# Patient Record
Sex: Male | Born: 1975 | Race: Black or African American | Hispanic: No | Marital: Single | Smoking: Never smoker
Health system: Southern US, Community
[De-identification: ages and names within clinical notes are randomized; demographics above are authoritative.]

## PROBLEM LIST (undated history)

## (undated) DIAGNOSIS — K7460 Unspecified cirrhosis of liver: Secondary | ICD-10-CM

## (undated) DIAGNOSIS — K8301 Primary sclerosing cholangitis: Secondary | ICD-10-CM

## (undated) HISTORY — PX: OTHER SURGICAL HISTORY: SHX169

## (undated) HISTORY — DX: Primary sclerosing cholangitis: K83.01

---

## 2017-10-11 DIAGNOSIS — K7460 Unspecified cirrhosis of liver: Secondary | ICD-10-CM

## 2017-10-11 HISTORY — DX: Unspecified cirrhosis of liver: K74.60

## 2018-01-07 ENCOUNTER — Emergency Department (HOSPITAL_COMMUNITY): Payer: PRIVATE HEALTH INSURANCE

## 2018-01-07 ENCOUNTER — Other Ambulatory Visit: Payer: Self-pay

## 2018-01-07 ENCOUNTER — Encounter (HOSPITAL_COMMUNITY): Payer: Self-pay | Admitting: Emergency Medicine

## 2018-01-07 ENCOUNTER — Emergency Department (HOSPITAL_COMMUNITY)
Admission: EM | Admit: 2018-01-07 | Discharge: 2018-01-07 | Disposition: A | Discharge status: Home / Self Care | Payer: PRIVATE HEALTH INSURANCE | Attending: Emergency Medicine | Admitting: Emergency Medicine

## 2018-01-07 DIAGNOSIS — R740 Nonspecific elevation of levels of transaminase and lactic acid dehydrogenase [LDH]: Secondary | ICD-10-CM | POA: Diagnosis not present

## 2018-01-07 DIAGNOSIS — M7989 Other specified soft tissue disorders: Secondary | ICD-10-CM

## 2018-01-07 DIAGNOSIS — R17 Unspecified jaundice: Secondary | ICD-10-CM | POA: Diagnosis not present

## 2018-01-07 DIAGNOSIS — R601 Generalized edema: Secondary | ICD-10-CM | POA: Insufficient documentation

## 2018-01-07 DIAGNOSIS — E8809 Other disorders of plasma-protein metabolism, not elsewhere classified: Secondary | ICD-10-CM

## 2018-01-07 DIAGNOSIS — R7401 Elevation of levels of liver transaminase levels: Secondary | ICD-10-CM

## 2018-01-07 DIAGNOSIS — R770 Abnormality of albumin: Secondary | ICD-10-CM | POA: Diagnosis not present

## 2018-01-07 DIAGNOSIS — R2243 Localized swelling, mass and lump, lower limb, bilateral: Secondary | ICD-10-CM | POA: Diagnosis present

## 2018-01-07 LAB — COMPREHENSIVE METABOLIC PANEL
ALBUMIN: 1.5 g/dL — AB (ref 3.5–5.0)
ALK PHOS: 410 U/L — AB (ref 38–126)
ALT: 74 U/L — ABNORMAL HIGH (ref 17–63)
ANION GAP: 7 (ref 5–15)
AST: 168 U/L — ABNORMAL HIGH (ref 15–41)
BILIRUBIN TOTAL: 14.1 mg/dL — AB (ref 0.3–1.2)
BUN: 10 mg/dL (ref 6–20)
CO2: 26 mmol/L (ref 22–32)
Calcium: 7.7 mg/dL — ABNORMAL LOW (ref 8.9–10.3)
Chloride: 102 mmol/L (ref 101–111)
Creatinine, Ser: 0.58 mg/dL — ABNORMAL LOW (ref 0.61–1.24)
GLUCOSE: 91 mg/dL (ref 65–99)
Potassium: 3.4 mmol/L — ABNORMAL LOW (ref 3.5–5.1)
Sodium: 135 mmol/L (ref 135–145)
TOTAL PROTEIN: 7.4 g/dL (ref 6.5–8.1)

## 2018-01-07 LAB — BRAIN NATRIURETIC PEPTIDE: B NATRIURETIC PEPTIDE 5: 34 pg/mL (ref 0.0–100.0)

## 2018-01-07 LAB — CBC WITH DIFFERENTIAL/PLATELET
BASOS ABS: 0.1 10*3/uL (ref 0.0–0.1)
BASOS PCT: 1 %
Eosinophils Absolute: 0.3 10*3/uL (ref 0.0–0.7)
Eosinophils Relative: 3 %
HEMATOCRIT: 32.6 % — AB (ref 39.0–52.0)
Hemoglobin: 11 g/dL — ABNORMAL LOW (ref 13.0–17.0)
Lymphocytes Relative: 12 %
Lymphs Abs: 1.2 10*3/uL (ref 0.7–4.0)
MCH: 29.5 pg (ref 26.0–34.0)
MCHC: 33.7 g/dL (ref 30.0–36.0)
MCV: 87.4 fL (ref 78.0–100.0)
MONO ABS: 0.7 10*3/uL (ref 0.1–1.0)
Monocytes Relative: 7 %
NEUTROS ABS: 7.7 10*3/uL (ref 1.7–7.7)
NEUTROS PCT: 77 %
Platelets: 319 10*3/uL (ref 150–400)
RBC: 3.73 MIL/uL — AB (ref 4.22–5.81)
RDW: 18.4 % — AB (ref 11.5–15.5)
WBC: 10 10*3/uL (ref 4.0–10.5)

## 2018-01-07 LAB — TROPONIN I: Troponin I: <0.03 ng/mL (ref <0.03)

## 2018-01-07 MED ORDER — SPIRONOLACTONE 25 MG PO TABS: 25.0000 mg | ORAL_TABLET | Freq: Every day | ORAL | 0 refills | Status: DC

## 2018-01-07 NOTE — Discharge Instructions (Addendum)
Stop taking the furosemide, for now.  We are prescribing spironolactone to help with the leg swelling.  Additionally it will help to use compression stockings, available for many drugstore, to help with the swelling.  Try to get the stockings that go above the knees.  Also, when you are at home, elevate your feet above your heart is much as possible.  Her test today showed that you have some liver problem, but it is not clear which type.  It is important to follow-up with a GI specialist, to help diagnose and treat you.  They will likely need to do some additional testing.  Also it is important to follow-up with a primary care doctor.  Use the attached list, to help you find a doctor, to see for regular medical care.  Also to help the leg swelling, avoid added salt in your food.

## 2018-01-07 NOTE — ED Provider Notes (Signed)
[facility] EMERGENCY DEPARTMENT Provider Note   CSN: [id number] Arrival date & time: 01/07/18  1031     History   Chief Complaint Chief Complaint  Patient presents with  . Foot Swelling    HPI Reginald Richardson is a 42 y.o. male.  He presents for evaluation of leg swelling ongoing for greater than 1 month, worse when working during the day, getting better at night when resting.  He was recently given Lasix, empirically to treat leg swelling, without improvement.  According to his mother he "scratches all over and has a rash on his legs."  No recent chest pain, shortness of breath, fever, chills, cough, weakness or dizziness.  He has not had other evaluations for this problem.  There are no other known modifying factors.  HPI  History reviewed. No pertinent past medical history.  There are no active problems to display for this patient.   Past Surgical History:  Procedure Laterality Date  . arm surgery          Home Medications    Prior to Admission medications   Medication Sig Start Date End Date Taking? Authorizing Provider  furosemide (LASIX) 20 MG tablet TAKE ONE TABLET BY MOUTH ONCE DAILY 12/29/17  Yes [provider]    Family History History reviewed. No pertinent family history.  Social History Social History   Tobacco Use  . Smoking status: Never Smoker  . Smokeless tobacco: Never Used  Substance Use Topics  . Alcohol use: Never    Frequency: Never  . Drug use: Never     Allergies   Patient has no known allergies.   Review of Systems Review of Systems  All other systems reviewed and are negative.    Physical Exam Updated Vital Signs BP 132/83   Pulse 98   Temp 97.8 F (36.6 C) (Oral)   Resp 16   Ht 6' (1.829 m)   Wt 77.1 kg (170 lb)   SpO2 100%   BMI 23.06 kg/m   Physical Exam  Constitutional: He is oriented to person, place, and time. He appears well-developed. No distress.  He appears somewhat unkempt and under  nourished.  HENT:  Head: Normocephalic and atraumatic.  Right Ear: External ear normal.  Left Ear: External ear normal.  Eyes: Pupils are equal, round, and reactive to light. Conjunctivae and EOM are normal.  Neck: Normal range of motion and phonation normal. Neck supple.  Cardiovascular: Normal rate, regular rhythm and normal heart sounds.  Pulmonary/Chest: Effort normal and breath sounds normal. No stridor. No respiratory distress. He has no wheezes. He has no rales. He exhibits no bony tenderness.  Abdominal: Soft. There is no tenderness.  Musculoskeletal: Normal range of motion. He exhibits edema (3+ bilateral lower extremity swelling).  Neurological: He is alert and oriented to person, place, and time. No cranial nerve deficit or sensory deficit. He exhibits normal muscle tone. Coordination normal.  Skin: Skin is warm, dry and intact.  Scattered rash, consisting of plaques with excoriations, diffusely on lower legs.  These areas of skin abnormality have the appearance of psoriasis.  Psychiatric: He has a normal mood and affect. His behavior is normal. Judgment and thought content normal.  Nursing note and vitals reviewed.    ED Treatments / Results  Labs (all labs ordered are listed, but only abnormal results are displayed) Labs Reviewed  COMPREHENSIVE METABOLIC PANEL - Abnormal; Notable for the following components:      Result Value   Potassium 3.4 (*)  Creatinine, Ser 0.58 (*)    Calcium 7.7 (*)    Albumin 1.5 (*)    AST 168 (*)    ALT 74 (*)    Alkaline Phosphatase 410 (*)    Total Bilirubin 14.1 (*)    All other components within normal limits  CBC WITH DIFFERENTIAL/PLATELET - Abnormal; Notable for the following components:   RBC 3.73 (*)    Hemoglobin 11.0 (*)    HCT 32.6 (*)    RDW 18.4 (*)    All other components within normal limits  TROPONIN I  BRAIN NATRIURETIC PEPTIDE    EKG EKG Interpretation  Date/Time:  Saturday January 07 2018 13:44:28  EDT Ventricular Rate:  86 PR Interval:    QRS Duration: 79 QT Interval:  371 QTC Calculation: 444 R Axis:   67 Text Interpretation:  Sinus rhythm Consider right atrial enlargement No old tracing to compare Confirmed by Mancel BaleWentz, Rogue Rafalski [phone]([id number]) on 01/07/2018 1:51:57 PM   Radiology Dg Chest 2 View  Result Date: 01/07/2018 CLINICAL DATA:  Bilateral leg swelling. EXAM: CHEST - 2 VIEW COMPARISON:  None. FINDINGS: Atelectasis in the right lung base and right middle lobe. The heart, hila, mediastinum, lungs, and pleura are otherwise normal. IMPRESSION: Atelectasis on the right.  No other acute abnormalities. Electronically Signed   By: Gerome Samavid  Williams III M.D   On: 01/07/2018 11:48    Procedures Procedures (including critical care time)  Medications Ordered in ED Medications - No data to display   Initial Impression / Assessment and Plan / ED Course  I have reviewed the triage vital signs and the nursing notes.  Pertinent labs & imaging results that were available during my care of the patient were reviewed by me and considered in my medical decision making (see chart for details).  Clinical Course as of Jan 07 1353  Sat Jan 07, 2018  1113 Patient has peripheral edema which is likely multifactorial.  He does not have any evident acute cardiac distress, or appear to be in congestive heart failure.  Screening evaluation initiated.   [EW]  1350 Normal  Brain natriuretic peptide [EW]  1352 Normal except slightly low potassium and creatinine, low albumin, elevated transaminase, elevated total bilirubin.  Comprehensive metabolic panel(!) [EW]  1353 Normal except hemoglobin low 11.0  CBC with Differential(!) [EW]  1353 Normal  Troponin I [EW]    Clinical Course User Index [EW] Mancel BaleWentz, Garald Rhew, MD     Patient Vitals for the past 24 hrs:  BP Temp Temp src Pulse Resp SpO2 Height Weight  01/07/18 1130 132/83 - - 98 16 100 % - -  01/07/18 1040 (!) 145/99 97.8 F (36.6 C) Oral 99 16 100 %  6' (1.829 m) 77.1 kg (170 lb)    1:22 PM Reevaluation with update and discussion. After initial assessment and treatment, an updated evaluation reveals he is comfortable has no further complaints.  He denies use of alcohol products.  He denies known exposure to hepatitis and states that he frequently donates blood.  Findings discussed with patient and mother, all questions answered. Mancel BaleElliott Dylynn Ketner    MDM-leg swelling without signs for central edema, suspect peripheral edema, likely multifactorial.  Doubt cellulitis, DVT, pneumonia, congestive heart failure, ACS, PE.  Incidental elevation of total bilirubin and low albumin, raising concern for intrinsic liver disease, possibly related to alcohol use.  Patient has not improved, with use of diuretic, Lasix.  Will give trial of spironolactone, and refer to GI for further management.  He will  likely need a primary care doctor as well.  He may benefit from the use of compression stockings, low-salt diet, and leg elevation.  Patient is nontoxic and does not require hospitalization at this time.  Discharge vital signs are normal.  Nursing Notes Reviewed/ Care Coordinated Applicable Imaging Reviewed Interpretation of Laboratory Data incorporated into ED treatment  The patient appears reasonably screened and/or stabilized for discharge and I doubt any other medical condition or other [facility] requiring further screening, evaluation, or treatment in the ED at this time prior to discharge.  Plan: Home Medications-stop Lasix, avoid APAP; Home Treatments-low-salt diet, compression stockings, elevate legs for swelling; return here if the recommended treatment, does not improve the symptoms; Recommended follow up-GI follow-up as soon as possible, to evaluate for cause of elevated bilirubin.  PCP follow-up for general medical care, no appointment as soon as possible    Final Clinical Impressions(s) / ED Diagnoses   Final diagnoses:  Elevated bilirubin  Transaminitis   Hypoalbuminemia  Leg swelling    ED Discharge Orders    None       Mancel Bale, MD 01/07/18 1402

## 2018-01-07 NOTE — ED Triage Notes (Signed)
Patient complaining of bilateral feet swelling x 2 weeks. States he went to Urgent Care and was given furosemide. States he has taken medication for approximately 2 weeks with no relief. Also complaining of intermittent shortness of breath.

## 2018-01-09 ENCOUNTER — Encounter: Payer: Self-pay | Admitting: Internal Medicine

## 2018-01-22 ENCOUNTER — Inpatient Hospital Stay (HOSPITAL_COMMUNITY)
Admission: EM | Admit: 2018-01-22 | Discharge: 2018-01-30 | DRG: 432 | Disposition: A | Discharge status: Home / Self Care | Payer: PRIVATE HEALTH INSURANCE | Attending: Internal Medicine | Admitting: Internal Medicine

## 2018-01-22 ENCOUNTER — Encounter (HOSPITAL_COMMUNITY): Payer: Self-pay | Admitting: Emergency Medicine

## 2018-01-22 ENCOUNTER — Inpatient Hospital Stay (HOSPITAL_COMMUNITY): Payer: PRIVATE HEALTH INSURANCE

## 2018-01-22 ENCOUNTER — Other Ambulatory Visit: Payer: Self-pay

## 2018-01-22 DIAGNOSIS — E871 Hypo-osmolality and hyponatremia: Secondary | ICD-10-CM | POA: Diagnosis present

## 2018-01-22 DIAGNOSIS — L02619 Cutaneous abscess of unspecified foot: Secondary | ICD-10-CM | POA: Diagnosis present

## 2018-01-22 DIAGNOSIS — E8770 Fluid overload, unspecified: Secondary | ICD-10-CM | POA: Diagnosis not present

## 2018-01-22 DIAGNOSIS — R7989 Other specified abnormal findings of blood chemistry: Secondary | ICD-10-CM

## 2018-01-22 DIAGNOSIS — R188 Other ascites: Secondary | ICD-10-CM | POA: Diagnosis present

## 2018-01-22 DIAGNOSIS — R945 Abnormal results of liver function studies: Secondary | ICD-10-CM | POA: Diagnosis present

## 2018-01-22 DIAGNOSIS — E8809 Other disorders of plasma-protein metabolism, not elsewhere classified: Secondary | ICD-10-CM

## 2018-01-22 DIAGNOSIS — D649 Anemia, unspecified: Secondary | ICD-10-CM | POA: Diagnosis not present

## 2018-01-22 DIAGNOSIS — K7460 Unspecified cirrhosis of liver: Principal | ICD-10-CM | POA: Diagnosis present

## 2018-01-22 DIAGNOSIS — K838 Other specified diseases of biliary tract: Secondary | ICD-10-CM | POA: Diagnosis not present

## 2018-01-22 DIAGNOSIS — D684 Acquired coagulation factor deficiency: Secondary | ICD-10-CM | POA: Diagnosis present

## 2018-01-22 DIAGNOSIS — K831 Obstruction of bile duct: Secondary | ICD-10-CM | POA: Diagnosis present

## 2018-01-22 DIAGNOSIS — J9811 Atelectasis: Secondary | ICD-10-CM | POA: Diagnosis present

## 2018-01-22 DIAGNOSIS — L989 Disorder of the skin and subcutaneous tissue, unspecified: Secondary | ICD-10-CM | POA: Diagnosis present

## 2018-01-22 DIAGNOSIS — R197 Diarrhea, unspecified: Secondary | ICD-10-CM | POA: Diagnosis present

## 2018-01-22 DIAGNOSIS — Z6825 Body mass index (BMI) 25.0-25.9, adult: Secondary | ICD-10-CM

## 2018-01-22 DIAGNOSIS — Z79899 Other long term (current) drug therapy: Secondary | ICD-10-CM | POA: Diagnosis not present

## 2018-01-22 DIAGNOSIS — L03116 Cellulitis of left lower limb: Secondary | ICD-10-CM | POA: Diagnosis present

## 2018-01-22 DIAGNOSIS — K8301 Primary sclerosing cholangitis: Secondary | ICD-10-CM | POA: Diagnosis present

## 2018-01-22 DIAGNOSIS — L03115 Cellulitis of right lower limb: Secondary | ICD-10-CM | POA: Diagnosis present

## 2018-01-22 DIAGNOSIS — L309 Dermatitis, unspecified: Secondary | ICD-10-CM | POA: Diagnosis present

## 2018-01-22 DIAGNOSIS — I8500 Esophageal varices without bleeding: Secondary | ICD-10-CM | POA: Diagnosis present

## 2018-01-22 DIAGNOSIS — E43 Unspecified severe protein-calorie malnutrition: Secondary | ICD-10-CM | POA: Diagnosis present

## 2018-01-22 DIAGNOSIS — R000 Tachycardia, unspecified: Secondary | ICD-10-CM | POA: Diagnosis present

## 2018-01-22 DIAGNOSIS — L298 Other pruritus: Secondary | ICD-10-CM | POA: Diagnosis present

## 2018-01-22 DIAGNOSIS — Z7682 Awaiting organ transplant status: Secondary | ICD-10-CM

## 2018-01-22 DIAGNOSIS — D638 Anemia in other chronic diseases classified elsewhere: Secondary | ICD-10-CM | POA: Diagnosis present

## 2018-01-22 DIAGNOSIS — L03119 Cellulitis of unspecified part of limb: Secondary | ICD-10-CM

## 2018-01-22 DIAGNOSIS — I82409 Acute embolism and thrombosis of unspecified deep veins of unspecified lower extremity: Secondary | ICD-10-CM

## 2018-01-22 HISTORY — DX: Unspecified cirrhosis of liver: K74.60

## 2018-01-22 LAB — COMPREHENSIVE METABOLIC PANEL
ALT: 76 U/L — ABNORMAL HIGH (ref 17–63)
ANION GAP: 10 (ref 5–15)
AST: 158 U/L — ABNORMAL HIGH (ref 15–41)
Albumin: 1.4 g/dL — ABNORMAL LOW (ref 3.5–5.0)
Alkaline Phosphatase: 389 U/L — ABNORMAL HIGH (ref 38–126)
BUN: 10 mg/dL (ref 6–20)
CO2: 24 mmol/L (ref 22–32)
Calcium: 8 mg/dL — ABNORMAL LOW (ref 8.9–10.3)
Chloride: 98 mmol/L — ABNORMAL LOW (ref 101–111)
Creatinine, Ser: 0.64 mg/dL (ref 0.61–1.24)
GFR calc non Af Amer: >60 mL/min (ref >60)
Glucose, Bld: 146 mg/dL — ABNORMAL HIGH (ref 65–99)
Potassium: 4 mmol/L (ref 3.5–5.1)
SODIUM: 132 mmol/L — AB (ref 135–145)
Total Bilirubin: 15 mg/dL — ABNORMAL HIGH (ref 0.3–1.2)
Total Protein: 7.5 g/dL (ref 6.5–8.1)

## 2018-01-22 LAB — CBC WITH DIFFERENTIAL/PLATELET
Basophils Absolute: 0.1 10*3/uL (ref 0.0–0.1)
Basophils Relative: 0 %
EOS ABS: 0.2 10*3/uL (ref 0.0–0.7)
EOS PCT: 1 %
HCT: 32.3 % — ABNORMAL LOW (ref 39.0–52.0)
Hemoglobin: 11.2 g/dL — ABNORMAL LOW (ref 13.0–17.0)
LYMPHS ABS: 2.3 10*3/uL (ref 0.7–4.0)
Lymphocytes Relative: 12 %
MCH: 30.2 pg (ref 26.0–34.0)
MCHC: 34.7 g/dL (ref 30.0–36.0)
MCV: 87.1 fL (ref 78.0–100.0)
MONOS PCT: 2 %
Monocytes Absolute: 0.3 10*3/uL (ref 0.1–1.0)
Neutro Abs: 16.8 10*3/uL (ref 1.7–7.7)
Neutrophils Relative %: 85 %
PLATELETS: 403 10*3/uL — AB (ref 150–400)
RBC: 3.71 MIL/uL — ABNORMAL LOW (ref 4.22–5.81)
RDW: 17.6 % — ABNORMAL HIGH (ref 11.5–15.5)
WBC: 19.7 10*3/uL — AB (ref 4.0–10.5)

## 2018-01-22 LAB — URINALYSIS, ROUTINE W REFLEX MICROSCOPIC
GLUCOSE, UA: NEGATIVE mg/dL
KETONES UR: NEGATIVE mg/dL
Leukocytes, UA: NEGATIVE
Nitrite: NEGATIVE
Protein, ur: NEGATIVE mg/dL
Specific Gravity, Urine: 1.021 (ref 1.005–1.030)
pH: 6 (ref 5.0–8.0)

## 2018-01-22 LAB — LACTIC ACID, PLASMA: Lactic Acid, Venous: 1.8 mmol/L (ref 0.5–1.9)

## 2018-01-22 LAB — ACETAMINOPHEN LEVEL: Acetaminophen (Tylenol), Serum: <10 ug/mL — ABNORMAL LOW (ref 10–30)

## 2018-01-22 LAB — PROTIME-INR
INR: 7.38
Prothrombin Time: 62.4 seconds — ABNORMAL HIGH (ref 11.4–15.2)

## 2018-01-22 LAB — PHOSPHORUS: PHOSPHORUS: 3.4 mg/dL (ref 2.5–4.6)

## 2018-01-22 LAB — ETHANOL: Alcohol, Ethyl (B): <10 mg/dL (ref <10)

## 2018-01-22 LAB — BILIRUBIN, DIRECT: BILIRUBIN DIRECT: 8 mg/dL — AB (ref 0.1–0.5)

## 2018-01-22 LAB — MAGNESIUM: Magnesium: 1.9 mg/dL (ref 1.7–2.4)

## 2018-01-22 MED ORDER — IOPAMIDOL (ISOVUE-300) INJECTION 61%
100.0000 mL | Freq: Once | INTRAVENOUS | Status: AC | PRN
  Administered 2018-01-22: 100 mL via INTRAVENOUS

## 2018-01-22 MED ORDER — VANCOMYCIN HCL IN DEXTROSE 1-5 GM/200ML-% IV SOLN
1000.0000 mg | Freq: Once | INTRAVENOUS | Status: AC
  Administered 2018-01-22: 1000 mg via INTRAVENOUS
  Filled 2018-01-22: qty 200

## 2018-01-22 MED ORDER — VITAMIN K1 10 MG/ML IJ SOLN
10.0000 mg | Freq: Once | INTRAVENOUS | Status: AC
  Administered 2018-01-22: 10 mg via INTRAVENOUS
  Filled 2018-01-22: qty 1

## 2018-01-22 MED ORDER — VITAMIN K1 10 MG/ML IJ SOLN
INTRAMUSCULAR | Status: AC
  Filled 2018-01-22: qty 1

## 2018-01-22 NOTE — H&P (Signed)
History and Physical    Shafer Swamy ZOX:[id number] DOB: 10-Feb-1976 DOA: 01/22/2018  PCP: Patient, No Pcp Per   Patient coming from: Home.  I have personally briefly reviewed patient's old medical records in [facility]  Chief Complaint: Feet swelling.  HPI: Reginald Richardson is a 42 y.o. male with no previous medical history who is coming to the emergency department due to lower extremity swelling for 2 weeks and 2 days of left foot pain/worsening swelling.  He noticed having swelling about 2 weeks ago and came to the ED.  Since then, he was using compression wraps on his legs and was improving until Friday when he noticed that his left foot was swelling and developing discomfort.  Since then, the swelling has increased and the tenderness has increased in intensity.  The area has also developed erythema.  On Saturday, the tenderness got very intense and he had shakes and chills for a short period.  He denies night sweats.  He denies trauma to the area.  He has had chronic jaundice and skin lesions for a lot of months or almost a year, according to his mother.  He has frequent pruritus and has multiple skin abrasions from scratching.  He recently started having occasional bleeding gums.  He does not have a PCP and has not establish since he was seen in the ED on 01/07/2018.  He denies headache, rhinorrhea, sore throat, dyspnea, hemoptysis, cough, chest pain, palpitations, dizziness, PND, orthopnea or pitting edema of the lower extremities.  He denies abdominal pain, nausea, vomiting, constipation, melena or hematochezia.  He has been getting soft to mildly loose stools BMs recently.  He states that his urine looks very dark, but denies dysuria or frequency.  No polyphagia, polydipsia or polyuria.  ED Course: Initial vital signs temperature 37.1C (98.7 F), pulse 116, respiration 16, blood pressure 143/93 mmHg and O2 sat 99% on room air.  He was given 1000 mg of vancomycin in the emergency  department.  His urinalysis shows an amber color with moderate hemoglobinuria and bilirubinuria.  White count was 19.7 with 85% neutrophils, 12% lymphocytes and 2% monocytes.  Hemoglobin 11.2 g/dL and platelets 811.  Hemoglobin 2 weeks ago was 11.0 g/dL.  PT was 62.4 seconds and INR 7.38.  His lactic acid was 1.8, sodium 132, potassium 4.0 chloride 98 and CO2 24 millimol/L.  Acetaminophen was less than 10, alcohol level less than 10, BUN was 10, creatinine 0.64, glucose 146, calcium 8.0, magnesium 1.9 and phosphorus 3.4 mg/dL.History of protein was 7.5 and albumin 1.4 g/dL.  AST 158, ALT 76 and alkaline phosphatase 389 U/L.  Total bilirubin was 15.0 and direct bilirubin 8.0 mg/dL.  Imaging: CT abdomen/pelvis shows 1. Several masslike areas noted within the liver, measuring 6.6 cm at the hepatic hilum and 4.8 cm at the left hepatic lobe. Malignancy cannot be excluded. Dynamic liver protocol MRI or CT would be helpful for further evaluation. 2. Findings of hepatic cirrhosis. Small volume ascites noted within the abdomen and pelvis. 3. 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 Varices noted about the distal esophagus and stomach. 4. Vague edema involving the wall of the colon likely reflects underlying hypoalbuminemia. 5. Nonobstructing 4 mm stone at the upper pole of the left kidney.  These images and full radiology report for further detail.  Review of Systems: As per HPI otherwise 10 point review of systems negative.    History reviewed. No pertinent past medical history.  Past Surgical History:  Procedure Laterality Date  . arm surgery       reports that he has never smoked. He has never used smokeless tobacco. He reports that he does not drink alcohol or use drugs.  No Known Allergies  Family History  Problem Relation Age of Onset  . Hypertension Maternal Grandmother   . Diabetes Maternal Grandmother   . Cancer Maternal Grandmother   .  Hypertension Maternal Aunt   . Diabetes Maternal Aunt     Prior to Admission medications   Medication Sig Start Date End Date Taking? Authorizing Provider  spironolactone (ALDACTONE) 25 MG tablet Take 1 tablet (25 mg total) by mouth daily. 01/07/18  Yes Mancel BaleWentz, Elliott, MD    Physical Exam: Vitals:   01/22/18 1943 01/22/18 1944  BP: (!) 143/93   Pulse: (!) 116   Resp: 16   Temp: 98.7 F (37.1 C)   TempSrc: Oral   SpO2: 99%   Weight:  77.1 kg (170 lb)  Height:  6' (1.829 m)    Constitutional: Looks chronically ill, but in NAD, calm, comfortable Eyes: PERRL, positive icterus, lids and conjunctivae normal ENMT: Mucous membranes are moist. Posterior pharynx clear of any exudate or lesions. Neck: normal, supple, no masses, no thyromegaly Respiratory: clear to auscultation bilaterally, no wheezing, no crackles. Normal respiratory effort. No accessory muscle use.  Cardiovascular: Tachycardic at 112 bpm, positive 2/6 systolic murmur, no rubs / gallops.  Bilateral lower extremity edema, left >> right. 2+ pedal pulses. No carotid bruits.  Abdomen: Mildly distended, soft, mild RUQ tenderness, no guarding/rebound/masses palpated. No hepatosplenomegaly. Bowel sounds positive.  Musculoskeletal: no clubbing / cyanosis.  Good ROM, no contractures. Normal muscle tone.  Skin: Positive edema, erythema, calor and tenderness to palpation of left foot and leg.  There are multiple hyperpigmented areas on feet from previous lesions.  Please see pictures below.  Also positive hyperpigmented macules on trunk, back and particularly right arm. Neurologic: CN 2-12 grossly intact. Sensation intact, DTR normal. Strength 5/5 in all 4.  Psychiatric: Normal judgment and insight. Alert and oriented x 3. Normal mood.      Left ankle medial area   Left ankle lateral area  Labs on Admission: I have personally reviewed following labs and imaging studies  CBC: Recent Labs  Lab 01/22/18 2031  WBC 19.7*    NEUTROABS 16.8  HGB 11.2*  HCT 32.3*  MCV 87.1  PLT 403*   Basic Metabolic Panel: Recent Labs  Lab 01/22/18 2031  NA 132*  K 4.0  CL 98*  CO2 24  GLUCOSE 146*  BUN 10  CREATININE 0.64  CALCIUM 8.0*  MG 1.9  PHOS 3.4   GFR: Estimated Creatinine Clearance: 135.2 mL/min (by C-G formula based on SCr of 0.64 mg/dL). Liver Function Tests: Recent Labs  Lab 01/22/18 2031  AST 158*  ALT 76*  ALKPHOS 389*  BILITOT 15.0*  PROT 7.5  ALBUMIN 1.4*   No results for input(s): LIPASE, AMYLASE in the last 168 hours. No results for input(s): AMMONIA in the last 168 hours. Coagulation Profile: Recent Labs  Lab 01/22/18 2031  INR 7.38*   Cardiac Enzymes: No results for input(s): CKTOTAL, CKMB, CKMBINDEX, TROPONINI in the last 168 hours. BNP (last 3 results) No results for input(s): PROBNP in the last 8760 hours. HbA1C: No results for input(s): HGBA1C in the last 72 hours. CBG: No results for input(s): GLUCAP in the last 168 hours. Lipid Profile: No results for input(s): CHOL, HDL, LDLCALC, TRIG, CHOLHDL,  LDLDIRECT in the last 72 hours. Thyroid Function Tests: No results for input(s): TSH, T4TOTAL, FREET4, T3FREE, THYROIDAB in the last 72 hours. Anemia Panel: No results for input(s): VITAMINB12, FOLATE, FERRITIN, TIBC, IRON, RETICCTPCT in the last 72 hours. Urine analysis: No results found for: COLORURINE, APPEARANCEUR, LABSPEC, PHURINE, GLUCOSEU, HGBUR, BILIRUBINUR, KETONESUR, PROTEINUR, UROBILINOGEN, NITRITE, LEUKOCYTESUR  Radiological Exams on Admission: No results found.  EKG: Independently reviewed.    Assessment/Plan Principal Problem:   Cellulitis of left foot Admit to telemetry/inpatient. Continue IV fluids. Continue vancomycin per pharmacy. Zosyn 3.375 g every 8 hours. Analgesics as needed.  Active Problems:   Obstructive jaundice CT abdomen/pelvis shows multiple masses in liver. Will get MRI in the morning for better view. Continue IV  fluids. Follow-up LFTs. Check CEA, AFP, hepatitis panel and ferritin. Consult GI/pathology.    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    Anemia No significant change in H&H from 2 weeks ago. Check anemia panel. Follow-up hematocrit and hemoglobin.    Hyponatremia Likely due to diarrhea and decreased oral intake. Continue normal saline infusion. Follow-up sodium level.    DVT prophylaxis: SCDs. Code Status: Full code. Family Communication: His mother Kimon Loewen was in his room. Disposition Plan: Admit for IV antibiotic therapy and further workup. Consults called:  Admission status: Inpatient/telemetry.   Bobette Mo MD [facility] Pager [phone].  If 7PM-7AM, please contact night-coverage [email or web address] Password [facility]  01/22/2018, 10:55 PM

## 2018-01-22 NOTE — ED Notes (Signed)
Critical result INR 7.38. MD Robb Matarrtiz notified.

## 2018-01-22 NOTE — ED Triage Notes (Addendum)
Pt reports continued swelling to bilateral feet. Pt was seen here about 2 weeks ago. Pt states he hit his left foot on something and states his left foot began swelling more. Both feet feel warm top the touch the left has more redness. Pt states the swelling in his feet had gotten better until Friday of this week.

## 2018-01-23 ENCOUNTER — Encounter (HOSPITAL_COMMUNITY): Payer: Self-pay | Admitting: Internal Medicine

## 2018-01-23 ENCOUNTER — Inpatient Hospital Stay (HOSPITAL_COMMUNITY): Payer: PRIVATE HEALTH INSURANCE

## 2018-01-23 DIAGNOSIS — R188 Other ascites: Secondary | ICD-10-CM

## 2018-01-23 DIAGNOSIS — K7460 Unspecified cirrhosis of liver: Secondary | ICD-10-CM

## 2018-01-23 LAB — IRON AND TIBC
Iron: 77 ug/dL (ref 45–182)
SATURATION RATIOS: 32 % (ref 17.9–39.5)
TIBC: 242 ug/dL — AB (ref 250–450)
UIBC: 165 ug/dL

## 2018-01-23 LAB — BASIC METABOLIC PANEL
ANION GAP: 9 (ref 5–15)
BUN: 9 mg/dL (ref 6–20)
CALCIUM: 7.6 mg/dL — AB (ref 8.9–10.3)
CO2: 23 mmol/L (ref 22–32)
CREATININE: 0.59 mg/dL — AB (ref 0.61–1.24)
Chloride: 98 mmol/L — ABNORMAL LOW (ref 101–111)
GLUCOSE: 89 mg/dL (ref 65–99)
Potassium: 3.7 mmol/L (ref 3.5–5.1)
Sodium: 130 mmol/L — ABNORMAL LOW (ref 135–145)

## 2018-01-23 LAB — CBC WITH DIFFERENTIAL/PLATELET
Basophils Absolute: 0.1 10*3/uL (ref 0.0–0.1)
Basophils Relative: 1 %
EOS PCT: 3 %
Eosinophils Absolute: 0.4 10*3/uL (ref 0.0–0.7)
HEMATOCRIT: 28.8 % — AB (ref 39.0–52.0)
Hemoglobin: 10 g/dL — ABNORMAL LOW (ref 13.0–17.0)
LYMPHS ABS: 1.2 10*3/uL (ref 0.7–4.0)
LYMPHS PCT: 7 %
MCH: 29.9 pg (ref 26.0–34.0)
MCHC: 34.7 g/dL (ref 30.0–36.0)
MCV: 86.2 fL (ref 78.0–100.0)
MONO ABS: 2 10*3/uL — AB (ref 0.1–1.0)
MONOS PCT: 12 %
NEUTROS ABS: 13.1 10*3/uL — AB (ref 1.7–7.7)
Neutrophils Relative %: 77 %
PLATELETS: 368 10*3/uL (ref 150–400)
RBC: 3.34 MIL/uL — ABNORMAL LOW (ref 4.22–5.81)
RDW: 17.7 % — AB (ref 11.5–15.5)
WBC: 16.8 10*3/uL — ABNORMAL HIGH (ref 4.0–10.5)

## 2018-01-23 LAB — PROTIME-INR
INR: 1.53
Prothrombin Time: 18.3 seconds — ABNORMAL HIGH (ref 11.4–15.2)

## 2018-01-23 LAB — HEPATIC FUNCTION PANEL
ALT: 66 U/L — AB (ref 17–63)
AST: 140 U/L — ABNORMAL HIGH (ref 15–41)
Albumin: 1.2 g/dL — ABNORMAL LOW (ref 3.5–5.0)
Alkaline Phosphatase: 347 U/L — ABNORMAL HIGH (ref 38–126)
BILIRUBIN DIRECT: 7.4 mg/dL — AB (ref 0.1–0.5)
BILIRUBIN INDIRECT: 6.2 mg/dL — AB (ref 0.3–0.9)
BILIRUBIN TOTAL: 13.6 mg/dL — AB (ref 0.3–1.2)
Total Protein: 6.7 g/dL (ref 6.5–8.1)

## 2018-01-23 LAB — RETICULOCYTES
RBC.: 3.34 MIL/uL — AB (ref 4.22–5.81)
Retic Count, Absolute: 133.6 10*3/uL (ref 19.0–186.0)
Retic Ct Pct: 4 % — ABNORMAL HIGH (ref 0.4–3.1)

## 2018-01-23 LAB — FOLATE: FOLATE: 19.8 ng/mL (ref >5.9)

## 2018-01-23 LAB — FERRITIN: Ferritin: 48 ng/mL (ref 24–336)

## 2018-01-23 LAB — VITAMIN B12: VITAMIN B 12: 1076 pg/mL — AB (ref 180–914)

## 2018-01-23 MED ORDER — OXYCODONE HCL 5 MG PO TABS
5.0000 mg | ORAL_TABLET | Freq: Four times a day (QID) | ORAL | Status: DC | PRN
  Administered 2018-01-26: 5 mg via ORAL
  Filled 2018-01-23: qty 1

## 2018-01-23 MED ORDER — HYDROXYZINE HCL 25 MG PO TABS
25.0000 mg | ORAL_TABLET | ORAL | Status: DC | PRN
  Administered 2018-01-28 – 2018-01-29 (×2): 25 mg via ORAL
  Filled 2018-01-23 (×2): qty 1

## 2018-01-23 MED ORDER — GADOBENATE DIMEGLUMINE 529 MG/ML IV SOLN
20.0000 mL | Freq: Once | INTRAVENOUS | Status: AC | PRN
  Administered 2018-01-23: 17 mL via INTRAVENOUS

## 2018-01-23 MED ORDER — VANCOMYCIN HCL 10 G IV SOLR
1500.0000 mg | Freq: Two times a day (BID) | INTRAVENOUS | Status: DC
  Administered 2018-01-23 – 2018-01-24 (×4): 1500 mg via INTRAVENOUS
  Filled 2018-01-23 (×5): qty 1500

## 2018-01-23 MED ORDER — CEFAZOLIN SODIUM-DEXTROSE 1-4 GM/50ML-% IV SOLN
1.0000 g | Freq: Three times a day (TID) | INTRAVENOUS | Status: DC
Start: 2018-01-23 — End: 2018-01-25
  Administered 2018-01-23 – 2018-01-25 (×7): 1 g via INTRAVENOUS
  Filled 2018-01-23 (×10): qty 50

## 2018-01-23 MED ORDER — ONDANSETRON HCL 4 MG PO TABS: 4.0000 mg | ORAL_TABLET | Freq: Four times a day (QID) | ORAL | Status: DC | PRN

## 2018-01-23 MED ORDER — PIPERACILLIN-TAZOBACTAM 3.375 G IVPB
3.3750 g | Freq: Three times a day (TID) | INTRAVENOUS | Status: DC
  Administered 2018-01-23: 3.375 g via INTRAVENOUS
  Filled 2018-01-23 (×3): qty 50

## 2018-01-23 MED ORDER — ONDANSETRON HCL 4 MG/2ML IJ SOLN: 4.0000 mg | Freq: Four times a day (QID) | INTRAMUSCULAR | Status: DC | PRN

## 2018-01-23 NOTE — Plan of Care (Signed)
Will continue to monitor.

## 2018-01-23 NOTE — Care Management Note (Signed)
Case Management Note  Patient Details  Name: Reginald Richardson MRN: [id number] Date of Birth: 06/25/1976  Subjective/Objective:             Admitted with cellulitis. Pt from home. ind with ADL's. Employed, has PCP insurance but no PCP.    Action/Plan: DC home with self care. CM provided list of PCP accepting new pt's in [place]. Plans to contact Dr. Letitia NeriFanta's office.  Expected Discharge Date:  01/25/18               Expected Discharge Plan:  Home/Self Care  In-House Referral:  NA  Discharge planning Services  CM Consult  Post Acute Care Choice:  NA Choice offered to:  NA  Status of Service:  Completed, signed off  If discussed at Long Length of Stay Meetings, dates discussed:    Additional Comments:  [name], Mirai Greenwood Demske, RN 01/23/2018, 1:58 PM

## 2018-01-23 NOTE — Progress Notes (Signed)
PROGRESS NOTE    Reginald Richardson  ZOX:[id number] DOB: 1976/06/29 DOA: 01/22/2018 PCP: Patient, No Pcp Per   Brief Narrative:   Reginald Richardson is a 42 y.o. male with no previous medical history who is coming to the emergency department due to lower extremity swelling for 2 weeks and 2 days of left foot pain/worsening swelling. He has had chronic jaundice and skin lesions for a lot of months or almost a year, according to his mother.  He has frequent pruritus and has multiple skin abrasions from scratching.  He recently started having occasional bleeding gums.  He has been admitted with bilateral lower extremity cellulitis and has been started on vancomycin and Zosyn.  He also appears to have findings of obstructive jaundice for which workup is currently pending with consultation to GI.   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 Bilateral lower extremity cellulitis.  Continue IV fluid with vancomycin and Zosyn with analgesics as needed.  Significant swelling in face of cirrhosis may lead to some thrombosis for which I have ordered bilateral lower extremity ultrasound. 2. Obstructive jaundice with cirrhosis.  Trend LFTs with other labs including hepatitis panel, CEA, and AFP pending.  GI consultation appreciated. 3. 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  Vitamin K supplementation given.  Recheck INR in a.m. 4. Anemia.  Panel pending.  No overt bleeding currently identified. 5. Hyponatremia.  Likely secondary to diarrhea and decreased oral intake.  Continue on normal saline and follow-up a.m. labs.   DVT prophylaxis:SCDs Code Status: Full Family Communication: None at bedside Disposition Plan: IV antibiotic treatment for cellulitis with GI evaluation pending   Consultants:   GI  Procedures:    None  Antimicrobials:   Vancomycin and Zosyn 4/14->   Subjective: Patient seen and evaluated today with ongoing complaints of bilateral lower extremity pain due to some swelling in the cellulitis.  No other acute events noted since this admission.  Objective: Vitals:   01/22/18 1944 01/22/18 2347 01/22/18 2348 01/23/18 0507  BP:  129/79  130/69  Pulse:  (!) 102  (!) 102  Resp:  (!) 22  (!) 22  Temp:  97.9 F (36.6 C)  98.6 F (37 C)  TempSrc:  Oral  Oral  SpO2:  100%  100%  Weight: 77.1 kg (170 lb)  86.1 kg (189 lb 13.1 oz)   Height: 6' (1.829 m)  6' (1.829 m)     Intake/Output Summary (Last 24 hours) at 01/23/2018 0904 Last data filed at 01/23/2018 0534 Gross per 24 hour  Intake -  Output 200 ml  Net -200 ml   Filed Weights   01/22/18 1944 01/22/18 2348  Weight: 77.1 kg (170 lb) 86.1 kg (189 lb 13.1 oz)    Examination:  General exam: Appears calm and comfortable  Respiratory system: Clear to auscultation. Respiratory effort normal. Cardiovascular system: S1 & S2 heard, RRR. No JVD, murmurs, rubs, gallops or clicks. No pedal edema. Gastrointestinal system: Abdomen is nondistended, soft and nontender. No organomegaly or masses felt. Normal bowel sounds heard. Central nervous system: Alert and oriented. No focal neurological deficits. Extremities: Edematous with erythema and tenderness to touch. Skin: Rashes and lesions present on dorsum of bilateral feet. Psychiatry: Judgement and insight appear normal. Mood & affect appropriate.     Data Reviewed: I have  personally reviewed following labs and imaging studies  CBC: Recent Labs  Lab 01/22/18 2031 01/23/18 0547  WBC 19.7* 16.8*  NEUTROABS 16.8 13.1*  HGB 11.2* 10.0*  HCT 32.3* 28.8*  MCV 87.1 86.2  PLT 403* 368   Basic Metabolic Panel: Recent Labs  Lab 01/22/18 2031 01/23/18 0547  NA 132* 130*  K 4.0 3.7  CL 98* 98*  CO2 24 23  GLUCOSE 146* 89  BUN 10 9  CREATININE 0.64 0.59*  CALCIUM 8.0*  7.6*  MG 1.9  --   PHOS 3.4  --    GFR: Estimated Creatinine Clearance: 136.1 mL/min (A) (by C-G formula based on SCr of 0.59 mg/dL (L)). Liver Function Tests: Recent Labs  Lab 01/22/18 2031 01/23/18 0547  AST 158* 140*  ALT 76* 66*  ALKPHOS 389* 347*  BILITOT 15.0* 13.6*  PROT 7.5 6.7  ALBUMIN 1.4* 1.2*   No results for input(s): LIPASE, AMYLASE in the last 168 hours. No results for input(s): AMMONIA in the last 168 hours. Coagulation Profile: Recent Labs  Lab 01/22/18 2031  INR 7.38*   Cardiac Enzymes: No results for input(s): CKTOTAL, CKMB, CKMBINDEX, TROPONINI in the last 168 hours. BNP (last 3 results) No results for input(s): PROBNP in the last 8760 hours. HbA1C: No results for input(s): HGBA1C in the last 72 hours. CBG: No results for input(s): GLUCAP in the last 168 hours. Lipid Profile: No results for input(s): CHOL, HDL, LDLCALC, TRIG, CHOLHDL, LDLDIRECT in the last 72 hours. Thyroid Function Tests: No results for input(s): TSH, T4TOTAL, FREET4, T3FREE, THYROIDAB in the last 72 hours. Anemia Panel: Recent Labs    01/23/18 0547  RETICCTPCT 4.0*   Sepsis Labs: Recent Labs  Lab 01/22/18 2031  LATICACIDVEN 1.8    Recent Results (from the past 240 hour(s))  Blood culture (routine x 2)     Status: None (Preliminary result)   Collection Time: 01/22/18  8:31 PM  Result Value Ref Range Status   Specimen Description BLOOD RIGHT FOREARM  Final   Special Requests   Final    BOTTLES DRAWN AEROBIC AND ANAEROBIC Blood Culture adequate volume   Culture   Final    NO GROWTH < 12 HOURS Performed at [facility], [address] [place], [place] [address]    Report Status PENDING  Incomplete  Blood culture (routine x 2)     Status: None (Preliminary result)   Collection Time: 01/22/18  8:43 PM  Result Value Ref Range Status   Specimen Description BLOOD LEFT FOREARM  Final   Special Requests   Final    BOTTLES DRAWN AEROBIC ONLY Blood Culture adequate volume    Culture   Final    NO GROWTH < 12 HOURS Performed at [facility], [address] [place], [place] [address]    Report Status PENDING  Incomplete         Radiology Studies: Ct Abdomen Pelvis W Contrast  Result Date: 01/22/2018 CLINICAL DATA:  Subacute onset of bilateral leg and foot swelling and pain. EXAM: CT ABDOMEN AND PELVIS WITH CONTRAST TECHNIQUE: Multidetector CT imaging of the abdomen and pelvis was performed using the standard protocol following bolus administration of intravenous contrast. CONTRAST:  ISOVUE-300 IOPAMIDOL (ISOVUE-300) INJECTION 61% COMPARISON:  None. FINDINGS: Lower chest: The visualized lung bases are grossly clear. The visualized portions of the mediastinum are unremarkable. Hepatobiliary: There is a nodular contour to the liver, reflecting hepatic cirrhosis. Several masslike areas are noted within the liver, measuring 6.6 cm at  the hepatic hilum and 4.8 cm at the left hepatic lobe. Malignancy cannot be excluded. The gallbladder is grossly unremarkable, though difficult to fully assess given surrounding ascites. The common bile duct is normal in caliber. Pancreas: The pancreas is within normal limits. Spleen: The spleen is unremarkable in appearance. Adrenals/Urinary Tract: The adrenal glands are unremarkable in appearance. A nonobstructing 4 mm stone is noted at the upper pole of the left kidney. There is no evidence of hydronephrosis. No obstructing ureteral stones are identified. No perinephric stranding is seen. Stomach/Bowel: Varices are noted about the distal esophagus and stomach. The stomach is unremarkable in appearance. The small bowel is within normal limits. The appendix is normal in caliber, without evidence of appendicitis. There is vague edema involving the wall of the colon, likely reflecting underlying hypoalbuminemia. The colon is otherwise unremarkable. Vascular/Lymphatic: The abdominal aorta is unremarkable in appearance. The inferior vena cava  is grossly unremarkable. No retroperitoneal lymphadenopathy is seen. No pelvic sidewall lymphadenopathy is identified. Reproductive: The bladder is mildly distended and grossly unremarkable. The prostate is normal in size. Other: Small volume ascites is noted within the abdomen and pelvis. Mildly prominent varices are seen tracking along the anterior abdominal wall and within the omentum. Musculoskeletal: No acute osseous abnormalities are identified. The visualized musculature is unremarkable in appearance. IMPRESSION: 1. Several masslike areas noted within the liver, measuring 6.6 cm at the hepatic hilum and 4.8 cm at the left hepatic lobe. Malignancy cannot be excluded. Dynamic liver protocol MRI or CT would be helpful for further evaluation. 2. Findings of hepatic cirrhosis. Small volume ascites noted within the abdomen and pelvis. 3. 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 Varices noted about the distal esophagus and stomach. 4. Vague edema involving the wall of the colon likely reflects underlying hypoalbuminemia. 5. Nonobstructing 4 mm stone at the upper pole of the left kidney. Electronically Signed   By: Roanna RaiderJeffery  Chang M.D.   On: 01/22/2018 23:24   Mr Liver W Wo Contrast  Result Date: 01/23/2018 CLINICAL DATA:  Bilateral leg and foot swelling and pain. Abnormal CT of the liver. EXAM: MRI ABDOMEN WITHOUT AND WITH CONTRAST TECHNIQUE: Multiplanar multisequence MR imaging of the abdomen was performed both before and after the administration of intravenous contrast. CONTRAST:  17mL MULTIHANCE GADOBENATE DIMEGLUMINE 529 MG/ML IV SOLN COMPARISON:  CT 01/22/2018 FINDINGS: Lower chest: No acute findings. Hepatobiliary: Sub optimal arterial phase and portal venous phase enhancement. The liver has a nodular contour and there is hypertrophy of the lateral segment of left lobe of liver. Within segment 7 wedge-shaped area of relative decreased T1 signal with  retraction of the liver capsule compatible with focal confluent fibrosis, image 24/series 4004. Mild-to-moderate diffuse and irregular intrahepatic biliary dilatation. Some of the dilated biliary radicles have a beaded appearance, image 253. Increased periportal T2 signal identified. No arterial phase enhancing lesions identified. On the 5 minutes delayed images there are no focal areas of washout identified. Gallbladder unremarkable. Mild intrahepatic bile duct dilatation measures up to 7 mm. No choledocholithiasis identified. Pancreas: No mass, inflammatory changes, or other parenchymal abnormality identified. Spleen: The spleen measures 10 x 6.3 by 13 cm (volume = 430 cm^3). Adrenals/Urinary Tract: The adrenal glands are normal. Unremarkable appearance of the kidneys. Stomach/Bowel: Visualized portions within the abdomen are unremarkable. Vascular/Lymphatic: Normal appearance the abdominal aorta. The main portal vein is patent. The abdominal aorta is normal in appearance. No aneurysm. Diffuse esophageal and gastric varices identified. The main portal  vein and its branches appear patent. Enlarged peripancreatic lymph node measures 1.8 cm, image 50/5006. Other: There is a large volume of ascites within the abdomen and pelvis. Musculoskeletal: No abnormal signal from within the bone marrow. IMPRESSION: 1. Morphologic features of liver compatible with cirrhosis. There is stigmata of portal venous hypertension including mild splenomegaly, varicosities and ascites. 2. Intrahepatic biliary dilatation. Some intrahepatic bile ducts have a beaded appearance which is suggestive of primary biliary cirrhosis. Correlation with tissue sampling is advised. 3. No focal enhancing liver abnormalities to suggest hepatoma. Area of focal confluent fibrosis is identified within segment 7 of the liver. Electronically Signed   By: Signa Kell M.D.   On: 01/23/2018 08:48        Scheduled Meds: Continuous Infusions: .  piperacillin-tazobactam 3.375 g (01/23/18 0523)  . vancomycin       LOS: 1 day    Time spent: 30 minutes    Pratik Hoover Brunette, DO [facility] Pager [phone]  If 7PM-7AM, please contact night-coverage [email or web address] Password [email or web address] 01/23/2018, 9:04 AM

## 2018-01-23 NOTE — Progress Notes (Addendum)
Pharmacy Antibiotic Note  Reginald Richardson is a 42 y.o. male admitted on 01/22/2018 with cellulitis.  Pharmacy has been consulted for Vancomcyin dosing. Patient is also on Zosyn.  Plan: Vancomycin 1500 mg IV every 12 hours. Goal trough 10-15 mcg/mL Cefazolin 1000 mg IV q8 hours. Monitor labs, c/s, and vanco trough as indicated  Height: 6' (182.9 cm) Weight: 189 lb 13.1 oz (86.1 kg) IBW/kg (Calculated) : 77.6  Temp (24hrs), Avg:98.4 F (36.9 C), Min:97.9 F (36.6 C), Max:98.7 F (37.1 C)  Recent Labs  Lab 01/22/18 2031 01/23/18 0547  WBC 19.7* 16.8*  CREATININE 0.64 0.59*  LATICACIDVEN 1.8  --     Estimated Creatinine Clearance: 136.1 mL/min (A) (by C-G formula based on SCr of 0.59 mg/dL (L)).    No Known Allergies  Antimicrobials this admission: Zosyn 4/14 >>4/15 Cefazolin 4/15>> Vancomcyin 4/14 >>   Dose adjustments this admission: N/A  Microbiology results: 4/14 BCx: pending   Thank you for allowing pharmacy to be a part of this patient's care.  Tad MooreSteven C Damario Gillie 01/23/2018 7:51 AM

## 2018-01-23 NOTE — Consult Note (Signed)
Referring Provider: Dr. Olevia Bowens  Primary Care Physician:  Patient, No Pcp Per Primary Gastroenterologist:  Dr. Gala Romney   Date of Admission: 01/22/18 Date of Consultation: 01/23/18  Reason for Consultation:  Cirrhosis   HPI:  Reginald Richardson is a 42 y.o. year old male with no known prior history of liver disease, presenting to the ED with lower extremity edema for past several week, left foot pain/swelling, CT abd/pelvis with contrast revealing cirrhosis, several masslike areas within the liver, further findings as below. MRI liver completed this morning with intrahepatic biliary dilatation, some ducts with beaded appearance. No evidence for hepatoma. No CBD stones. [place] lower extremities negative for DVT.   Had presented to the ED 3/30 with lower extremity and was placed on diuretics. He was to follow-up with GI due to abnormal LFTs (bilirubin at that time 14, Alk Phos 410, AST 168, ALT 74, Albumin 1.5). No abdominal imaging or hepatitis panel completed in ED several weeks ago. CXR with atelectasis. He was instructed to follow with GI and had appt upcoming 5/1.   States he was doing fine for awhile, wearing compression wraps. 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 This is when he became concerned and decided to seek ED evaluation again.   States everyone had told him his eyes were yellow, but he thought he was just tired. States his mom always told him his eyes looked yellow. Unsure timeframe. Feels "pretty good". No abdominal pain. No N/V. Great appetite. States he has eczema and uses topical medication, which soothed it down, otherwise, denies diffuse pruritis or chronic pruritis.  No weight loss. No family history of liver disease. No alcohol use. No drug use. No tattoos. No prior blood transfusions. No fever/ chills. No OTC medications, no herbal supplements. No tylenol.    INR 7.38 on admission. Received Vit K 10 mg IV on admission. Repeat INR has  not been checked. He denies any mental status changes, confusion, fatigue. Remains afebrile.   Past Medical History:  Diagnosis Date  . Cirrhosis ([place]) 2019   presented with decompensated cirrhosis    Past Surgical History:  Procedure Laterality Date  . arm surgery Right    age 89     Prior to Admission medications   Medication Sig Start Date End Date Taking? Authorizing Provider  spironolactone (ALDACTONE) 25 MG tablet Take 1 tablet (25 mg total) by mouth daily. 01/07/18  Yes Daleen Bo, MD    Current Facility-Administered Medications  Medication Dose Route Frequency Provider Last Rate Last Dose  . hydrOXYzine (ATARAX/VISTARIL) tablet 25 mg  25 mg Oral Q4H PRN Reubin Milan, MD      . ondansetron [facility]) tablet 4 mg  4 mg Oral Q6H PRN Reubin Milan, MD       Or  . ondansetron [facility]) injection 4 mg  4 mg Intravenous Q6H PRN Reubin Milan, MD      . oxyCODONE (Oxy IR/ROXICODONE) immediate release tablet 5 mg  5 mg Oral Q6H PRN Reubin Milan, MD      . piperacillin-tazobactam (ZOSYN) IVPB 3.375 g  3.375 g Intravenous Q8H Reubin Milan, MD   Stopped at 01/23/18 0935  . vancomycin (VANCOCIN) 1,500 mg in sodium chloride 0.9 % 500 mL IVPB  1,500 mg Intravenous Q12H Shah, Pratik D, DO 250 mL/hr at 01/23/18 0935 1,500 mg at 01/23/18 0935    Allergies as of 01/22/2018  . (No Known Allergies)  Family History  Problem Relation Age of Onset  . Hypertension Maternal Grandmother   . Diabetes Maternal Grandmother   . Cancer Maternal Grandmother   . Hypertension Maternal Aunt   . Diabetes Maternal Aunt   . Liver disease Neg Hx   . Colon cancer Neg Hx   . Colon polyps Neg Hx     Social History   Socioeconomic History  . Marital status: Single    Spouse name: Not on file  . Number of children: Not on file  . Years of education: Not on file  . Highest education level: Not on file  Occupational History  . 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 Financial resource strain: Not on file  . Food insecurity:    Worry: Not on file    Inability: Not on file  . Transportation needs:    Medical: Not on file    Non-medical: Not on file  Tobacco Use  . Smoking status: Never Smoker  . Smokeless tobacco: Never Used  Substance and Sexual Activity  . Alcohol use: Never    Frequency: Never  . Drug use: Never    Comment: Occasionally drinks tea. Drink a soda occasionally.  . Sexual activity: Not on file  Lifestyle  . Physical activity:    Days per week: Not on file    Minutes per session: Not on file  . Stress: Not on file  Relationships  . Social connections:    Talks on phone: Not on file    Gets together: Not on file    Attends religious service: Not on file    Active member of club or organization: Not on file    Attends meetings of clubs or organizations: Not on file    Relationship status: Not on file  . Intimate partner violence:    Fear of current or ex partner: Not on file    Emotionally abused: Not on file    Physically abused: Not on file    Forced sexual activity: Not on file  Other Topics Concern  . Not on file  Social History Narrative  . Not on file    Review of Systems: Gen: Denies fever, chills, loss of appetite, change in weight or weight loss CV: Denies chest pain, heart palpitations, syncope, edema  Resp: Denies shortness of breath with rest, cough, wheezing GI: see HPI  GU : Denies urinary burning, urinary frequency, urinary incontinence.  MS: see HPI  Derm: see HPI  Psych: Denies depression, anxiety,confusion, or memory loss Heme: Denies bruising, bleeding, and enlarged lymph nodes.  Physical Exam: Vital signs in last 24 hours: Temp:  [97.9 F (36.6 C)-98.7 F (37.1 C)] 98.6 F (37 C) (04/15 0507) Pulse Rate:  [102-116] 102 (04/15 0507) Resp:  [16-22] 22 (04/15 0507) BP: (129-143)/(69-93) 130/69 (04/15 0507) SpO2:  [99 %-100 %] 100 % (04/15 0507) Weight:  [170 lb (77.1 kg)-189 lb  13.1 oz (86.1 kg)] 189 lb 13.1 oz (86.1 kg) (04/14 2348) Last BM Date: 01/22/18 General:   Alert and oriented X 4, no distress, easily conversant Head:  Normocephalic and atraumatic. Eyes:  +Scleral icterus  Ears:  Normal auditory acuity. Nose:  No deformity, discharge,  or lesions. Mouth:  No deformity or lesions Lungs:  Clear throughout to auscultation bilaterally Heart:  S1 S2 present, systolic murmur appreciated  Abdomen:  Mild to moderate non-tense ascites, no TTP, rebound, or guarding, +BS   Rectal:  Deferred  Msk:  Symmetrical without gross deformities. Normal  posture. Extremities:  LLE edema > right, erythema to LLE, warm to touch, lesions to bilateral lower extremities as noted in H&P pictures, pedal edema Neurologic:  Alert and  oriented x4 Psych:  Alert and cooperative. Normal mood and affect.  Intake/Output from previous day: 04/14 0701 - 04/15 0700 In: -  Out: 200 [Urine:200] Intake/Output this shift: Total I/O In: 50 [IV Piggyback:50] Out: 225 [Urine:225]  Lab Results: Recent Labs    01/22/18 2031 01/23/18 0547  WBC 19.7* 16.8*  HGB 11.2* 10.0*  HCT 32.3* 28.8*  PLT 403* 368   BMET Recent Labs    01/22/18 2031 01/23/18 0547  NA 132* 130*  K 4.0 3.7  CL 98* 98*  CO2 24 23  GLUCOSE 146* 89  BUN 10 9  CREATININE 0.64 0.59*  CALCIUM 8.0* 7.6*   LFT Recent Labs    01/22/18 2031 01/23/18 0547  PROT 7.5 6.7  ALBUMIN 1.4* 1.2*  AST 158* 140*  ALT 76* 66*  ALKPHOS 389* 347*  BILITOT 15.0* 13.6*  BILIDIR 8.0* 7.4*  IBILI  --  6.2*   PT/INR Recent Labs    01/22/18 2031  LABPROT 62.4*  INR 7.38*   Studies/Results: Ct Abdomen Pelvis W Contrast  Result Date: 01/22/2018 CLINICAL DATA:  Subacute onset of bilateral leg and foot swelling and pain. EXAM: CT ABDOMEN AND PELVIS WITH CONTRAST TECHNIQUE: Multidetector CT imaging of the abdomen and pelvis was performed using the standard protocol following bolus administration of intravenous contrast.  CONTRAST:  174m ISOVUE-300 IOPAMIDOL (ISOVUE-300) INJECTION 61% COMPARISON:  None. FINDINGS: Lower chest: The visualized lung bases are grossly clear. The visualized portions of the mediastinum are unremarkable. Hepatobiliary: There is a nodular contour to the liver, reflecting hepatic cirrhosis. Several masslike areas are noted within the liver, measuring 6.6 cm at the hepatic hilum and 4.8 cm at the left hepatic lobe. Malignancy cannot be excluded. The gallbladder is grossly unremarkable, though difficult to fully assess given surrounding ascites. The common bile duct is normal in caliber. Pancreas: The pancreas is within normal limits. Spleen: The spleen is unremarkable in appearance. Adrenals/Urinary Tract: The adrenal glands are unremarkable in appearance. A nonobstructing 4 mm stone is noted at the upper pole of the left kidney. There is no evidence of hydronephrosis. No obstructing ureteral stones are identified. No perinephric stranding is seen. Stomach/Bowel: Varices are noted about the distal esophagus and stomach. The stomach is unremarkable in appearance. The small bowel is within normal limits. The appendix is normal in caliber, without evidence of appendicitis. There is vague edema involving the wall of the colon, likely reflecting underlying hypoalbuminemia. The colon is otherwise unremarkable. Vascular/Lymphatic: The abdominal aorta is unremarkable in appearance. The inferior vena cava is grossly unremarkable. No retroperitoneal lymphadenopathy is seen. No pelvic sidewall lymphadenopathy is identified. Reproductive: The bladder is mildly distended and grossly unremarkable. The prostate is normal in size. Other: Small volume ascites is noted within the abdomen and pelvis. Mildly prominent varices are seen tracking along the anterior abdominal wall and within the omentum. Musculoskeletal: No acute osseous abnormalities are identified. The visualized musculature is unremarkable in appearance.  IMPRESSION: 1. Several masslike areas noted within the liver, measuring 6.6 cm at the hepatic hilum and 4.8 cm at the left hepatic lobe. Malignancy cannot be excluded. Dynamic liver protocol MRI or CT would be helpful for further evaluation. 2. Findings of hepatic cirrhosis. Small volume ascites noted within the abdomen and pelvis. 3. Mildly prominent varices tracking along the anterior abdominal wall and  within the omentum. Varices noted about the distal esophagus and stomach. 4. Vague edema involving the wall of the colon likely reflects underlying hypoalbuminemia. 5. Nonobstructing 4 mm stone at the upper pole of the left kidney. Electronically Signed   By: Garald Balding M.D.   On: 01/22/2018 23:24   Mr Liver W Wo Contrast  Result Date: 01/23/2018 CLINICAL DATA:  Bilateral leg and foot swelling and pain. Abnormal CT of the liver. EXAM: MRI ABDOMEN WITHOUT AND WITH CONTRAST TECHNIQUE: Multiplanar multisequence MR imaging of the abdomen was performed both before and after the administration of intravenous contrast. CONTRAST:  20m MULTIHANCE GADOBENATE DIMEGLUMINE 529 MG/ML IV SOLN COMPARISON:  CT 01/22/2018 FINDINGS: Lower chest: No acute findings. Hepatobiliary: Sub optimal arterial phase and portal venous phase enhancement. The liver has a nodular contour and there is hypertrophy of the lateral segment of left lobe of liver. Within segment 7 wedge-shaped area of relative decreased T1 signal with retraction of the liver capsule compatible with focal confluent fibrosis, image 24/series 4004. Mild-to-moderate diffuse and irregular intrahepatic biliary dilatation. Some of the dilated biliary radicles have a beaded appearance, image 253. Increased periportal T2 signal identified. No arterial phase enhancing lesions identified. On the 5 minutes delayed images there are no focal areas of washout identified. Gallbladder unremarkable. Mild intrahepatic bile duct dilatation measures up to 7 mm. No choledocholithiasis  identified. Pancreas: No mass, inflammatory changes, or other parenchymal abnormality identified. Spleen: The spleen measures 10 x 6.3 by 13 cm (volume = 430 cm^3). Adrenals/Urinary Tract: The adrenal glands are normal. Unremarkable appearance of the kidneys. Stomach/Bowel: Visualized portions within the abdomen are unremarkable. Vascular/Lymphatic: Normal appearance the abdominal aorta. The main portal vein is patent. The abdominal aorta is normal in appearance. No aneurysm. Diffuse esophageal and gastric varices identified. The main portal vein and its branches appear patent. Enlarged peripancreatic lymph node measures 1.8 cm, image 50/5006. Other: There is a large volume of ascites within the abdomen and pelvis. Musculoskeletal: No abnormal signal from within the bone marrow. IMPRESSION: 1. Morphologic features of liver compatible with cirrhosis. There is stigmata of portal venous hypertension including mild splenomegaly, varicosities and ascites. 2. Intrahepatic biliary dilatation. Some intrahepatic bile ducts have a beaded appearance which is suggestive of primary biliary cirrhosis. Correlation with tissue sampling is advised. 3. No focal enhancing liver abnormalities to suggest hepatoma. Area of focal confluent fibrosis is identified within segment 7 of the liver. Electronically Signed   By: TKerby MoorsM.D.   On: 01/23/2018 08:48   [place] Im[place] Lower Bilateral  Result Date: 01/23/2018 CLINICAL DATA:  42year old male with a history of leg swelling/edema EXAM: BILATERAL LOWER EXTREMITY VENOUS DOPPLER ULTRASOUND TECHNIQUE: Gray-scale sonography with graded compression, as well as color Doppler and duplex ultrasound were performed to evaluate the lower extremity deep venous systems from the level of the common femoral vein and including the common femoral, femoral, profunda femoral, popliteal and calf veins including the posterior tibial, peroneal and gastrocnemius veins when visible. The superficial  great saphenous vein was also interrogated. Spectral Doppler was utilized to evaluate flow at rest and with distal augmentation maneuvers in the common femoral, femoral and popliteal veins. COMPARISON:  None. FINDINGS: RIGHT LOWER EXTREMITY Common Femoral Vein: No evidence of thrombus. Normal compressibility, respiratory phasicity and response to augmentation. Saphenofemoral Junction: No evidence of thrombus. Normal compressibility and flow on color Doppler imaging. Profunda Femoral Vein: No evidence of thrombus. Normal compressibility and flow on color Doppler imaging. Femoral Vein: No evidence of  thrombus. Normal compressibility, respiratory phasicity and response to augmentation. Popliteal Vein: No evidence of thrombus. Normal compressibility, respiratory phasicity and response to augmentation. Calf Veins: No evidence of thrombus. Normal compressibility and flow on color Doppler imaging. Superficial Great Saphenous Vein: No evidence of thrombus. Normal compressibility and flow on color Doppler imaging. Other Findings:  Edema LEFT LOWER EXTREMITY Common Femoral Vein: No evidence of thrombus. Normal compressibility, respiratory phasicity and response to augmentation. Saphenofemoral Junction: No evidence of thrombus. Normal compressibility and flow on color Doppler imaging. Profunda Femoral Vein: No evidence of thrombus. Normal compressibility and flow on color Doppler imaging. Femoral Vein: No evidence of thrombus. Normal compressibility, respiratory phasicity and response to augmentation. Popliteal Vein: No evidence of thrombus. Normal compressibility, respiratory phasicity and response to augmentation. Calf Veins: No evidence of thrombus. Normal compressibility and flow on color Doppler imaging. Superficial Great Saphenous Vein: No evidence of thrombus. Normal compressibility and flow on color Doppler imaging. Other Findings:  Edema IMPRESSION: Sonographic survey of the bilateral lower extremities negative for  DVT. Bilateral lower extremity edema Electronically Signed   By: Corrie Mckusick D.O.   On: 01/23/2018 10:26    Impression: 42 year old male with no prior known history of liver disease, presents with cellulitis of lower extremities, jaundice, coagulopathy with INR greater than 7 and receiving Vit K 10 mg IV yesterday, and CT imaging with cirrhosis and concern for liver masses. 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  MELD Na calculated on admission 39. LFTs with mild  improvement today, and repeat INR is 1.53 today. Calculated MELD Na 26 today. He is without any signs of encephalopathy and remains alert and oriented. Hepatitis panel is pending. I have ordered additional extensive serologies; iron studies pending as well. Will need liver biopsy for further evaluation. Keep close monitoring for any mental status changes, confusion, clinical deterioration, which would necessitate transfer to tertiary care facility. Will ultimately need outpatient cirrhosis care and would also recommend establishing care with transplant center as outpatient.   Lower extremity cellulitis: per attending  Plan: Monitor for any signs of clinical deterioration Follow LFTs, INR: repeat in am Await hepatitis panel and further extensive serologies ordered Anticipate liver biopsy  Recommend establishing care with transplant center as outpatient  Will continue to follow with you  Annitta Needs, PhD, ANP-BC [facility]      LOS: 1 day    01/23/2018, 10:36 AM

## 2018-01-24 ENCOUNTER — Inpatient Hospital Stay (HOSPITAL_COMMUNITY): Payer: PRIVATE HEALTH INSURANCE

## 2018-01-24 DIAGNOSIS — R188 Other ascites: Secondary | ICD-10-CM

## 2018-01-24 DIAGNOSIS — L03116 Cellulitis of left lower limb: Secondary | ICD-10-CM

## 2018-01-24 LAB — HEPATITIS PANEL, ACUTE
HCV Ab: 0.1 s/co ratio (ref 0.0–0.9)
Hep A IgM: NEGATIVE
Hep B C IgM: NEGATIVE
Hepatitis B Surface Ag: NEGATIVE

## 2018-01-24 LAB — BASIC METABOLIC PANEL
Anion gap: 8 (ref 5–15)
BUN: 8 mg/dL (ref 6–20)
CHLORIDE: 101 mmol/L (ref 101–111)
CO2: 23 mmol/L (ref 22–32)
CREATININE: 0.46 mg/dL — AB (ref 0.61–1.24)
Calcium: 7.6 mg/dL — ABNORMAL LOW (ref 8.9–10.3)
GFR calc Af Amer: >60 mL/min (ref >60)
GFR calc non Af Amer: >60 mL/min (ref >60)
GLUCOSE: 79 mg/dL (ref 65–99)
Potassium: 3.8 mmol/L (ref 3.5–5.1)
Sodium: 132 mmol/L — ABNORMAL LOW (ref 135–145)

## 2018-01-24 LAB — CBC WITH DIFFERENTIAL/PLATELET
BASOS ABS: 0.1 10*3/uL (ref 0.0–0.1)
Basophils Relative: 1 %
Eosinophils Absolute: 0.4 10*3/uL (ref 0.0–0.7)
Eosinophils Relative: 3 %
HEMATOCRIT: 29.4 % — AB (ref 39.0–52.0)
HEMOGLOBIN: 10.1 g/dL — AB (ref 13.0–17.0)
LYMPHS PCT: 10 %
Lymphs Abs: 1.4 10*3/uL (ref 0.7–4.0)
MCH: 30 pg (ref 26.0–34.0)
MCHC: 34.4 g/dL (ref 30.0–36.0)
MCV: 87.2 fL (ref 78.0–100.0)
MONOS PCT: 10 %
Monocytes Absolute: 1.4 10*3/uL — ABNORMAL HIGH (ref 0.1–1.0)
NEUTROS ABS: 10.3 10*3/uL — AB (ref 1.7–7.7)
NEUTROS PCT: 76 %
Platelets: 372 10*3/uL (ref 150–400)
RBC: 3.37 MIL/uL — ABNORMAL LOW (ref 4.22–5.81)
RDW: 17.4 % — ABNORMAL HIGH (ref 11.5–15.5)
WBC: 13.6 10*3/uL — ABNORMAL HIGH (ref 4.0–10.5)

## 2018-01-24 LAB — CERULOPLASMIN: Ceruloplasmin: 55.5 mg/dL — ABNORMAL HIGH (ref 16.0–31.0)

## 2018-01-24 LAB — HEPATIC FUNCTION PANEL
ALBUMIN: 1.2 g/dL — AB (ref 3.5–5.0)
ALK PHOS: 341 U/L — AB (ref 38–126)
ALT: 64 U/L — AB (ref 17–63)
AST: 141 U/L — ABNORMAL HIGH (ref 15–41)
BILIRUBIN TOTAL: 14.2 mg/dL — AB (ref 0.3–1.2)
Bilirubin, Direct: 8.2 mg/dL — ABNORMAL HIGH (ref 0.1–0.5)
Indirect Bilirubin: 6 mg/dL — ABNORMAL HIGH (ref 0.3–0.9)
Total Protein: 6.7 g/dL (ref 6.5–8.1)

## 2018-01-24 LAB — IGG, IGA, IGM
IGM (IMMUNOGLOBULIN M), SRM: 158 mg/dL (ref 20–172)
IgA: 922 mg/dL — ABNORMAL HIGH (ref 90–386)
IgG (Immunoglobin G), Serum: 2714 mg/dL — ABNORMAL HIGH (ref 700–1600)

## 2018-01-24 LAB — PROTIME-INR
INR: 1.45
Prothrombin Time: 17.5 seconds — ABNORMAL HIGH (ref 11.4–15.2)

## 2018-01-24 LAB — HEPATITIS A ANTIBODY, TOTAL: HEP A TOTAL AB: POSITIVE — AB

## 2018-01-24 LAB — HEPATITIS B SURFACE ANTIBODY,QUALITATIVE: Hep B S Ab: NONREACTIVE

## 2018-01-24 LAB — MRSA PCR SCREENING: MRSA by PCR: NEGATIVE

## 2018-01-24 LAB — CEA: CEA1: 9.9 ng/mL — AB (ref 0.0–4.7)

## 2018-01-24 LAB — ANTINUCLEAR ANTIBODIES, IFA: ANTINUCLEAR ANTIBODIES, IFA: NEGATIVE

## 2018-01-24 LAB — HEPATITIS B CORE ANTIBODY, TOTAL: Hep B Core Total Ab: NEGATIVE

## 2018-01-24 LAB — AFP TUMOR MARKER: AFP, Serum, Tumor Marker: 1.1 ng/mL (ref 0.0–8.3)

## 2018-01-24 LAB — MITOCHONDRIAL ANTIBODIES: Mitochondrial M2 Ab, IgG: 32.5 Units — ABNORMAL HIGH (ref 0.0–20.0)

## 2018-01-24 LAB — HIV ANTIBODY (ROUTINE TESTING W REFLEX): HIV Screen 4th Generation wRfx: NONREACTIVE

## 2018-01-24 LAB — ANTI-SMOOTH MUSCLE ANTIBODY, IGG: F-ACTIN AB IGG: 26 U — AB (ref 0–19)

## 2018-01-24 MED ORDER — FUROSEMIDE 40 MG PO TABS
40.0000 mg | ORAL_TABLET | Freq: Every day | ORAL | Status: DC
Start: 2018-01-25 — End: 2018-01-30
  Administered 2018-01-25 – 2018-01-30 (×6): 40 mg via ORAL
  Filled 2018-01-24 (×6): qty 1

## 2018-01-24 MED ORDER — ALBUMIN HUMAN 25 % IV SOLN
25.0000 g | INTRAVENOUS | Status: AC
  Filled 2018-01-24: qty 100

## 2018-01-24 MED ORDER — FUROSEMIDE 20 MG PO TABS
20.0000 mg | ORAL_TABLET | Freq: Every day | ORAL | Status: DC
  Administered 2018-01-24: 20 mg via ORAL
  Filled 2018-01-24: qty 1

## 2018-01-24 MED ORDER — SPIRONOLACTONE 25 MG PO TABS
50.0000 mg | ORAL_TABLET | Freq: Once | ORAL | Status: AC
  Administered 2018-01-24: 50 mg via ORAL
  Filled 2018-01-24: qty 2

## 2018-01-24 MED ORDER — FUROSEMIDE 20 MG PO TABS
20.0000 mg | ORAL_TABLET | Freq: Once | ORAL | Status: AC
  Administered 2018-01-24: 20 mg via ORAL
  Filled 2018-01-24: qty 1

## 2018-01-24 MED ORDER — SPIRONOLACTONE 25 MG PO TABS
50.0000 mg | ORAL_TABLET | Freq: Every day | ORAL | Status: DC
  Administered 2018-01-24: 50 mg via ORAL
  Filled 2018-01-24: qty 2

## 2018-01-24 MED ORDER — SPIRONOLACTONE 25 MG PO TABS
100.0000 mg | ORAL_TABLET | Freq: Every day | ORAL | Status: DC
Start: 2018-01-25 — End: 2018-01-30
  Administered 2018-01-25 – 2018-01-30 (×6): 100 mg via ORAL
  Filled 2018-01-24 (×6): qty 4

## 2018-01-24 NOTE — Progress Notes (Signed)
Insufficient amount of ascites for paracentesis. Will start lasix and aldactone. Monitor renal function, electrolytes.

## 2018-01-24 NOTE — Progress Notes (Signed)
Subjective: Stomach feels full, tight after eating. No shortness of breath. States his stomach "used to be flat".   Objective: Vital signs in last 24 hours: Temp:  [98.2 F (36.8 C)-98.7 F (37.1 C)] 98.3 F (36.8 C) (04/16 0518) Pulse Rate:  [89-114] 89 (04/16 0518) Resp:  [16-20] 18 (04/16 0518) BP: (128-137)/(65-80) 129/65 (04/16 0518) SpO2:  [99 %] 99 % (04/16 0518) Last BM Date: 01/22/18 General:   Alert and oriented, pleasant Eyes:  +scleral icterus  Abdomen:  +BS, tense ascites, no rebound or guarding Extremities:  With left lower extremity cellulitis, marked pedal edema left greater than right Neurologic:  Alert and  oriented x4 Psych:  Alert and cooperative. Normal mood and affect.  Intake/Output from previous day: 04/15 0701 - 04/16 0700 In: 2760 [P.O.:1560; IV Piggyback:1200] Out: 1725 [Urine:1725] Intake/Output this shift: Total I/O In: 240 [P.O.:240] Out: 200 [Urine:200]  Lab Results: Recent Labs    01/22/18 2031 01/23/18 0547 01/24/18 0533  WBC 19.7* 16.8* 13.6*  HGB 11.2* 10.0* 10.1*  HCT 32.3* 28.8* 29.4*  PLT 403* 368 372   BMET Recent Labs    01/22/18 2031 01/23/18 0547 01/24/18 0533  NA 132* 130* 132*  K 4.0 3.7 3.8  CL 98* 98* 101  CO2 24 23 23   GLUCOSE 146* 89 79  BUN 10 9 8   CREATININE 0.64 0.59* 0.46*  CALCIUM 8.0* 7.6* 7.6*   LFT Recent Labs    01/22/18 2031 01/23/18 0547 01/24/18 0533  PROT 7.5 6.7 6.7  ALBUMIN 1.4* 1.2* 1.2*  AST 158* 140* 141*  ALT 76* 66* 64*  ALKPHOS 389* 347* 341*  BILITOT 15.0* 13.6* 14.2*  BILIDIR 8.0* 7.4* 8.2*  IBILI  --  6.2* 6.0*   PT/INR Recent Labs    01/23/18 1151 01/24/18 0533  LABPROT 18.3* 17.5*  INR 1.53 1.45   Hepatitis Panel Recent Labs    01/22/18 2013  HEPBSAG Negative  HCVAB 0.1  HEPAIGM Negative  HEPBIGM Negative    Studies/Results: Ct Abdomen Pelvis W Contrast  Result Date: 01/22/2018 CLINICAL DATA:  Subacute onset of bilateral leg and foot swelling and  pain. EXAM: CT ABDOMEN AND PELVIS WITH CONTRAST TECHNIQUE: Multidetector CT imaging of the abdomen and pelvis was performed using the standard protocol following bolus administration of intravenous contrast. CONTRAST:  ISOVUE-300 IOPAMIDOL (ISOVUE-300) INJECTION 61% COMPARISON:  None. FINDINGS: Lower chest: The visualized lung bases are grossly clear. The visualized portions of the mediastinum are unremarkable. Hepatobiliary: There is a nodular contour to the liver, reflecting hepatic cirrhosis. Several masslike areas are noted within the liver, measuring 6.6 cm at the hepatic hilum and 4.8 cm at the left hepatic lobe. Malignancy cannot be excluded. The gallbladder is grossly unremarkable, though difficult to fully assess given surrounding ascites. The common bile duct is normal in caliber. Pancreas: The pancreas is within normal limits. Spleen: The spleen is unremarkable in appearance. Adrenals/Urinary Tract: The adrenal glands are unremarkable in appearance. A nonobstructing 4 mm stone is noted at the upper pole of the left kidney. There is no evidence of hydronephrosis. No obstructing ureteral stones are identified. No perinephric stranding is seen. Stomach/Bowel: Varices are noted about the distal esophagus and stomach. The stomach is unremarkable in appearance. The small bowel is within normal limits. The appendix is normal in caliber, without evidence of appendicitis. There is vague edema involving the wall of the colon, likely reflecting underlying hypoalbuminemia. The colon is otherwise unremarkable. Vascular/Lymphatic: The abdominal aorta is unremarkable in appearance.  The inferior vena cava is grossly unremarkable. No retroperitoneal lymphadenopathy is seen. No pelvic sidewall lymphadenopathy is identified. Reproductive: The bladder is mildly distended and grossly unremarkable. The prostate is normal in size. Other: Small volume ascites is noted within the abdomen and pelvis. Mildly prominent varices  are seen tracking along the anterior abdominal wall and within the omentum. Musculoskeletal: No acute osseous abnormalities are identified. The visualized musculature is unremarkable in appearance. IMPRESSION: 1. Several masslike areas noted within the liver, measuring 6.6 cm at the hepatic hilum and 4.8 cm at the left hepatic lobe. Malignancy cannot be excluded. Dynamic liver protocol MRI or CT would be helpful for further evaluation. 2. Findings of hepatic cirrhosis. Small volume ascites noted within the abdomen and pelvis. 3. 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 Varices noted about the distal esophagus and stomach. 4. Vague edema involving the wall of the colon likely reflects underlying hypoalbuminemia. 5. Nonobstructing 4 mm stone at the upper pole of the left kidney. Electronically Signed   By: Roanna RaiderJeffery  Chang M.D.   On: 01/22/2018 23:24   Mr Liver W Wo Contrast  Result Date: 01/23/2018 CLINICAL DATA:  Bilateral leg and foot swelling and pain. Abnormal CT of the liver. EXAM: MRI ABDOMEN WITHOUT AND WITH CONTRAST TECHNIQUE: Multiplanar multisequence MR imaging of the abdomen was performed both before and after the administration of intravenous contrast. CONTRAST:  17mL MULTIHANCE GADOBENATE DIMEGLUMINE 529 MG/ML IV SOLN COMPARISON:  CT 01/22/2018 FINDINGS: Lower chest: No acute findings. Hepatobiliary: Sub optimal arterial phase and portal venous phase enhancement. The liver has a nodular contour and there is hypertrophy of the lateral segment of left lobe of liver. Within segment 7 wedge-shaped area of relative decreased T1 signal with retraction of the liver capsule compatible with focal confluent fibrosis, image 24/series 4004. Mild-to-moderate diffuse and irregular intrahepatic biliary dilatation. Some of the dilated biliary radicles have a beaded appearance, image 253. Increased periportal T2 signal identified. No arterial phase enhancing lesions  identified. On the 5 minutes delayed images there are no focal areas of washout identified. Gallbladder unremarkable. Mild intrahepatic bile duct dilatation measures up to 7 mm. No choledocholithiasis identified. Pancreas: No mass, inflammatory changes, or other parenchymal abnormality identified. Spleen: The spleen measures 10 x 6.3 by 13 cm (volume = 430 cm^3). Adrenals/Urinary Tract: The adrenal glands are normal. Unremarkable appearance of the kidneys. Stomach/Bowel: Visualized portions within the abdomen are unremarkable. Vascular/Lymphatic: Normal appearance the abdominal aorta. The main portal vein is patent. The abdominal aorta is normal in appearance. No aneurysm. Diffuse esophageal and gastric varices identified. The main portal vein and its branches appear patent. Enlarged peripancreatic lymph node measures 1.8 cm, image 50/5006. Other: There is a large volume of ascites within the abdomen and pelvis. Musculoskeletal: No abnormal signal from within the bone marrow. IMPRESSION: 1. Morphologic features of liver compatible with cirrhosis. There is stigmata of portal venous hypertension including mild splenomegaly, varicosities and ascites. 2. Intrahepatic biliary dilatation. Some intrahepatic bile ducts have a beaded appearance which is suggestive of primary biliary cirrhosis. Correlation with tissue sampling is advised. 3. No focal enhancing liver abnormalities to suggest hepatoma. Area of focal confluent fibrosis is identified within segment 7 of the liver. Electronically Signed   By: Signa Kellaylor  Stroud M.D.   On: 01/23/2018 08:48   [place] Venous Img Lower Bilateral  Result Date: 01/23/2018 CLINICAL DATA:  42 year old male with a history of leg swelling/edema EXAM: BILATERAL LOWER EXTREMITY VENOUS DOPPLER ULTRASOUND TECHNIQUE: Gray-scale  sonography with graded compression, as well as color Doppler and duplex ultrasound were performed to evaluate the lower extremity deep venous systems from the level of the  common femoral vein and including the common femoral, femoral, profunda femoral, popliteal and calf veins including the posterior tibial, peroneal and gastrocnemius veins when visible. The superficial great saphenous vein was also interrogated. Spectral Doppler was utilized to evaluate flow at rest and with distal augmentation maneuvers in the common femoral, femoral and popliteal veins. COMPARISON:  None. FINDINGS: RIGHT LOWER EXTREMITY Common Femoral Vein: No evidence of thrombus. Normal compressibility, respiratory phasicity and response to augmentation. Saphenofemoral Junction: No evidence of thrombus. Normal compressibility and flow on color Doppler imaging. Profunda Femoral Vein: No evidence of thrombus. Normal compressibility and flow on color Doppler imaging. Femoral Vein: No evidence of thrombus. Normal compressibility, respiratory phasicity and response to augmentation. Popliteal Vein: No evidence of thrombus. Normal compressibility, respiratory phasicity and response to augmentation. Calf Veins: No evidence of thrombus. Normal compressibility and flow on color Doppler imaging. Superficial Great Saphenous Vein: No evidence of thrombus. Normal compressibility and flow on color Doppler imaging. Other Findings:  Edema LEFT LOWER EXTREMITY Common Femoral Vein: No evidence of thrombus. Normal compressibility, respiratory phasicity and response to augmentation. Saphenofemoral Junction: No evidence of thrombus. Normal compressibility and flow on color Doppler imaging. Profunda Femoral Vein: No evidence of thrombus. Normal compressibility and flow on color Doppler imaging. Femoral Vein: No evidence of thrombus. Normal compressibility, respiratory phasicity and response to augmentation. Popliteal Vein: No evidence of thrombus. Normal compressibility, respiratory phasicity and response to augmentation. Calf Veins: No evidence of thrombus. Normal compressibility and flow on color Doppler imaging. Superficial Great  Saphenous Vein: No evidence of thrombus. Normal compressibility and flow on color Doppler imaging. Other Findings:  Edema IMPRESSION: Sonographic survey of the bilateral lower extremities negative for DVT. Bilateral lower extremity edema Electronically Signed   By: Gilmer Mor D.O.   On: 01/23/2018 10:26    Assessment: 42 year old male admitted with lower extremity cellulitis and new findings of advanced chronic liver disease, suspicious for PSC. MRI liver completed due to concern for hepatic masses, but no mass appreciated. MELD Na 24 today, coagulopathy improving, LFTs remaining overall stable. No evidence for encephalopathy. Tense ascites on exam but no concern for SBP. Thus far, acute hepatitis panel negative, IgG significantly elevated, ceruloplasmin elevated but this is acute phase reactant, awaiting ANA, ASMA, and AMA. AFP tumor marker normal. No evidence of iron overload. CEA is abnormally elevated. 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 No overt GI bleeding. Will also need EGD to assess esophageal varices at time of colonoscopy.   Ascites: paracentesis ordered for today with fluid analysis. No concern for SBP.   Cellulitis: per attending  Cirrhosis: will need transplant center referral regardless of etiology.    Plan: US paracentesis today: first ever. Fluid analysis requested.  Albumin IV at 4 liters Hold on starting diuretic therapy today due to para but will need lasix/aldactone combo starting tomorrow after review of labs Outpatient MRCP to further evaluate intrahepatic ducts, ?PSC Referral to liver transplant facility [facility]) as outpatient, may likely need liver biopsy as well Colonoscopy/EGD in near future in timely fashion   Gelene Mink, PhD, ANP-BC [facility]     LOS: 2 days    01/24/2018, 9:26 AM

## 2018-01-24 NOTE — Progress Notes (Signed)
PROGRESS NOTE    [name]  WUJ:[id number] DOB: 1975/12/29 DOA: 01/22/2018 PCP: Patient, No Pcp Per   Brief Narrative:   Reginald Richardson is a 42 y.o. male with no previous medical history who is coming to the emergency department due to lower extremity swelling for 2 weeks and 2 days of left foot pain/worsening swelling. He has had chronic jaundice and skin lesions for a lot of months or almost a year, according to his mother.  He has frequent pruritus and has multiple skin abrasions from scratching.  He recently started having occasional bleeding gums.  He has been admitted with bilateral lower extremity cellulitis and has been started on vancomycin and Zosyn.  He also appears to have findings of obstructive jaundice for which workup is currently pending with consultation to GI.   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 Bilateral lower extremity cellulitis.  Continue IV fluid with vancomycin and Ancef with analgesics as needed.  Blood cultures with no growth in 2 days.  MRSA nares ordered and could likely discontinue vancomycin soon.  Leukocytosis downtrending. 2. Obstructive jaundice with cirrhosis.  LFTs stable.  Appreciate GI consultation with plans for paracentesis and further fluid evaluation.  Albumin IV to be given with plans to initiate Lasix and Aldactone in a.m. 3. 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  This has improved with vitamin K administration and is within normal limits. 4. Anemia.  This is currently stable with no significant findings on anemia panel.  No overt bleeding currently identified. 5. Hyponatremia.  Likely secondary to diarrhea and overall decreased oral intake.  Hold IV fluid with paracentesis plan today.  Drainage of this fluid will likely help  hyponatremia as well as diuresis that is scheduled for tomorrow.  This continues to remain stable.   DVT prophylaxis:SCDs Code Status: Full Family Communication: None at bedside Disposition Plan: IV antibiotic treatment for cellulitis with further GI workup currently pending as noted above   Consultants:   GI  Procedures:   Bilateral Doppler ultrasound of lower extremities without DVT.  Antimicrobials:   Vancomycin and Zosyn 4/14->4/15  Vancomycin and Ancef 4/15->   Subjective: Patient seen and evaluated today with noted improvement in bilateral lower extremity pain this morning.  He denies any shortness of breath but does have some fullness to the stomach.  Objective: Vitals:   01/23/18 0507 01/23/18 1625 01/23/18 2033 01/24/18 0518  BP: 130/69 128/80 137/72 129/65  Pulse: (!) 102 95 (!) 114 89  Resp: (!) 22 20 16 18   Temp: 98.6 F (37 C) 98.7 F (37.1 C) 98.2 F (36.8 C) 98.3 F (36.8 C)  TempSrc: Oral Oral Oral Oral  SpO2: 100% 99% 99% 99%  Weight:      Height:        Intake/Output Summary (Last 24 hours) at 01/24/2018 1132 Last data filed at 01/24/2018 0900 Gross per 24 hour  Intake 2710 ml  Output 1700 ml  Net 1010 ml   Filed Weights   01/22/18 1944 01/22/18 2348  Weight: 77.1 kg (170 lb) 86.1 kg (189 lb 13.1 oz)    Examination:  General exam: Appears calm and comfortable  Respiratory system: Clear to auscultation. Respiratory effort normal. Cardiovascular system: S1 & S2 heard, RRR. No JVD, murmurs,  rubs, gallops or clicks. No pedal edema. Gastrointestinal system: Abdomen is nondistended, soft and nontender. No organomegaly or masses felt. Normal bowel sounds heard. Central nervous system: Alert and oriented. No focal neurological deficits. Extremities: Edematous with erythema and tenderness to touch. Skin: Rashes and lesions present on dorsum of bilateral feet. Psychiatry: Judgement and insight appear normal. Mood & affect appropriate.      Data Reviewed: I have personally reviewed following labs and imaging studies  CBC: Recent Labs  Lab 01/22/18 2031 01/23/18 0547 01/24/18 0533  WBC 19.7* 16.8* 13.6*  NEUTROABS 16.8 13.1* 10.3*  HGB 11.2* 10.0* 10.1*  HCT 32.3* 28.8* 29.4*  MCV 87.1 86.2 87.2  PLT 403* 368 372   Basic Metabolic Panel: Recent Labs  Lab 01/22/18 2031 01/23/18 0547 01/24/18 0533  NA 132* 130* 132*  K 4.0 3.7 3.8  CL 98* 98* 101  CO2 24 23 23   GLUCOSE 146* 89 79  BUN 10 9 8   CREATININE 0.64 0.59* 0.46*  CALCIUM 8.0* 7.6* 7.6*  MG 1.9  --   --   PHOS 3.4  --   --    GFR: Estimated Creatinine Clearance: 136.1 mL/min (A) (by C-G formula based on SCr of 0.46 mg/dL (L)). Liver Function Tests: Recent Labs  Lab 01/22/18 2031 01/23/18 0547 01/24/18 0533  AST 158* 140* 141*  ALT 76* 66* 64*  ALKPHOS 389* 347* 341*  BILITOT 15.0* 13.6* 14.2*  PROT 7.5 6.7 6.7  ALBUMIN 1.4* 1.2* 1.2*   No results for input(s): LIPASE, AMYLASE in the last 168 hours. No results for input(s): AMMONIA in the last 168 hours. Coagulation Profile: Recent Labs  Lab 01/22/18 2031 01/23/18 1151 01/24/18 0533  INR 7.38* 1.53 1.45   Cardiac Enzymes: No results for input(s): CKTOTAL, CKMB, CKMBINDEX, TROPONINI in the last 168 hours. BNP (last 3 results) No results for input(s): PROBNP in the last 8760 hours. HbA1C: No results for input(s): HGBA1C in the last 72 hours. CBG: No results for input(s): GLUCAP in the last 168 hours. Lipid Profile: No results for input(s): CHOL, HDL, LDLCALC, TRIG, CHOLHDL, LDLDIRECT in the last 72 hours. Thyroid Function Tests: No results for input(s): TSH, T4TOTAL, FREET4, T3FREE, THYROIDAB in the last 72 hours. Anemia Panel: Recent Labs    01/23/18 0547  VITAMINB12 1,076*  FOLATE 19.8  FERRITIN 48  TIBC 242*  IRON 77  RETICCTPCT 4.0*   Sepsis Labs: Recent Labs  Lab 01/22/18 2031  LATICACIDVEN 1.8    Recent Results (from the past 240 hour(s))  Blood  culture (routine x 2)     Status: None (Preliminary result)   Collection Time: 01/22/18  8:31 PM  Result Value Ref Range Status   Specimen Description BLOOD RIGHT FOREARM  Final   Special Requests   Final    BOTTLES DRAWN AEROBIC AND ANAEROBIC Blood Culture adequate volume   Culture   Final    NO GROWTH 2 DAYS Performed at [facility], [address] [place], [place] [address]    Report Status PENDING  Incomplete  Blood culture (routine x 2)     Status: None (Preliminary result)   Collection Time: 01/22/18  8:43 PM  Result Value Ref Range Status   Specimen Description BLOOD LEFT FOREARM  Final   Special Requests   Final    BOTTLES DRAWN AEROBIC ONLY Blood Culture adequate volume   Culture   Final    NO GROWTH 2 DAYS Performed at [facility], [address] [place], [place] [address]  Report Status PENDING  Incomplete         Radiology Studies: Ct Abdomen Pelvis W Contrast  Result Date: 01/22/2018 CLINICAL DATA:  Subacute onset of bilateral leg and foot swelling and pain. EXAM: CT ABDOMEN AND PELVIS WITH CONTRAST TECHNIQUE: Multidetector CT imaging of the abdomen and pelvis was performed using the standard protocol following bolus administration of intravenous contrast. CONTRAST:  ISOVUE-300 IOPAMIDOL (ISOVUE-300) INJECTION 61% COMPARISON:  None. FINDINGS: Lower chest: The visualized lung bases are grossly clear. The visualized portions of the mediastinum are unremarkable. Hepatobiliary: There is a nodular contour to the liver, reflecting hepatic cirrhosis. Several masslike areas are noted within the liver, measuring 6.6 cm at the hepatic hilum and 4.8 cm at the left hepatic lobe. Malignancy cannot be excluded. The gallbladder is grossly unremarkable, though difficult to fully assess given surrounding ascites. The common bile duct is normal in caliber. Pancreas: The pancreas is within normal limits. Spleen: The spleen is unremarkable in appearance. Adrenals/Urinary Tract: The  adrenal glands are unremarkable in appearance. A nonobstructing 4 mm stone is noted at the upper pole of the left kidney. There is no evidence of hydronephrosis. No obstructing ureteral stones are identified. No perinephric stranding is seen. Stomach/Bowel: Varices are noted about the distal esophagus and stomach. The stomach is unremarkable in appearance. The small bowel is within normal limits. The appendix is normal in caliber, without evidence of appendicitis. There is vague edema involving the wall of the colon, likely reflecting underlying hypoalbuminemia. The colon is otherwise unremarkable. Vascular/Lymphatic: The abdominal aorta is unremarkable in appearance. The inferior vena cava is grossly unremarkable. No retroperitoneal lymphadenopathy is seen. No pelvic sidewall lymphadenopathy is identified. Reproductive: The bladder is mildly distended and grossly unremarkable. The prostate is normal in size. Other: Small volume ascites is noted within the abdomen and pelvis. Mildly prominent varices are seen tracking along the anterior abdominal wall and within the omentum. Musculoskeletal: No acute osseous abnormalities are identified. The visualized musculature is unremarkable in appearance. IMPRESSION: 1. Several masslike areas noted within the liver, measuring 6.6 cm at the hepatic hilum and 4.8 cm at the left hepatic lobe. Malignancy cannot be excluded. Dynamic liver protocol MRI or CT would be helpful for further evaluation. 2. Findings of hepatic cirrhosis. Small volume ascites noted within the abdomen and pelvis. 3. 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 Varices noted about the distal esophagus and stomach. 4. Vague edema involving the wall of the colon likely reflects underlying hypoalbuminemia. 5. Nonobstructing 4 mm stone at the upper pole of the left kidney. Electronically Signed   By: Roanna Raider M.D.   On: 01/22/2018 23:24   Mr Liver W Wo  Contrast  Result Date: 01/23/2018 CLINICAL DATA:  Bilateral leg and foot swelling and pain. Abnormal CT of the liver. EXAM: MRI ABDOMEN WITHOUT AND WITH CONTRAST TECHNIQUE: Multiplanar multisequence MR imaging of the abdomen was performed both before and after the administration of intravenous contrast. CONTRAST:  17mL MULTIHANCE GADOBENATE DIMEGLUMINE 529 MG/ML IV SOLN COMPARISON:  CT 01/22/2018 FINDINGS: Lower chest: No acute findings. Hepatobiliary: Sub optimal arterial phase and portal venous phase enhancement. The liver has a nodular contour and there is hypertrophy of the lateral segment of left lobe of liver. Within segment 7 wedge-shaped area of relative decreased T1 signal with retraction of the liver capsule compatible with focal confluent fibrosis, image 24/series 4004. Mild-to-moderate diffuse and irregular intrahepatic biliary dilatation. Some of the dilated biliary radicles have a  beaded appearance, image 253. Increased periportal T2 signal identified. No arterial phase enhancing lesions identified. On the 5 minutes delayed images there are no focal areas of washout identified. Gallbladder unremarkable. Mild intrahepatic bile duct dilatation measures up to 7 mm. No choledocholithiasis identified. Pancreas: No mass, inflammatory changes, or other parenchymal abnormality identified. Spleen: The spleen measures 10 x 6.3 by 13 cm (volume = 430 cm^3). Adrenals/Urinary Tract: The adrenal glands are normal. Unremarkable appearance of the kidneys. Stomach/Bowel: Visualized portions within the abdomen are unremarkable. Vascular/Lymphatic: Normal appearance the abdominal aorta. The main portal vein is patent. The abdominal aorta is normal in appearance. No aneurysm. Diffuse esophageal and gastric varices identified. The main portal vein and its branches appear patent. Enlarged peripancreatic lymph node measures 1.8 cm, image 50/5006. Other: There is a large volume of ascites within the abdomen and pelvis.  Musculoskeletal: No abnormal signal from within the bone marrow. IMPRESSION: 1. Morphologic features of liver compatible with cirrhosis. There is stigmata of portal venous hypertension including mild splenomegaly, varicosities and ascites. 2. Intrahepatic biliary dilatation. Some intrahepatic bile ducts have a beaded appearance which is suggestive of primary biliary cirrhosis. Correlation with tissue sampling is advised. 3. No focal enhancing liver abnormalities to suggest hepatoma. Area of focal confluent fibrosis is identified within segment 7 of the liver. Electronically Signed   By: Signa Kell M.D.   On: 01/23/2018 08:48   US Venous Img Lower Bilateral  Result Date: 01/23/2018 CLINICAL DATA:  42 year old male with a history of leg swelling/edema EXAM: BILATERAL LOWER EXTREMITY VENOUS DOPPLER ULTRASOUND TECHNIQUE: Gray-scale sonography with graded compression, as well as color Doppler and duplex ultrasound were performed to evaluate the lower extremity deep venous systems from the level of the common femoral vein and including the common femoral, femoral, profunda femoral, popliteal and calf veins including the posterior tibial, peroneal and gastrocnemius veins when visible. The superficial great saphenous vein was also interrogated. Spectral Doppler was utilized to evaluate flow at rest and with distal augmentation maneuvers in the common femoral, femoral and popliteal veins. COMPARISON:  None. FINDINGS: RIGHT LOWER EXTREMITY Common Femoral Vein: No evidence of thrombus. Normal compressibility, respiratory phasicity and response to augmentation. Saphenofemoral Junction: No evidence of thrombus. Normal compressibility and flow on color Doppler imaging. Profunda Femoral Vein: No evidence of thrombus. Normal compressibility and flow on color Doppler imaging. Femoral Vein: No evidence of thrombus. Normal compressibility, respiratory phasicity and response to augmentation. Popliteal Vein: No evidence of  thrombus. Normal compressibility, respiratory phasicity and response to augmentation. Calf Veins: No evidence of thrombus. Normal compressibility and flow on color Doppler imaging. Superficial Great Saphenous Vein: No evidence of thrombus. Normal compressibility and flow on color Doppler imaging. Other Findings:  Edema LEFT LOWER EXTREMITY Common Femoral Vein: No evidence of thrombus. Normal compressibility, respiratory phasicity and response to augmentation. Saphenofemoral Junction: No evidence of thrombus. Normal compressibility and flow on color Doppler imaging. Profunda Femoral Vein: No evidence of thrombus. Normal compressibility and flow on color Doppler imaging. Femoral Vein: No evidence of thrombus. Normal compressibility, respiratory phasicity and response to augmentation. Popliteal Vein: No evidence of thrombus. Normal compressibility, respiratory phasicity and response to augmentation. Calf Veins: No evidence of thrombus. Normal compressibility and flow on color Doppler imaging. Superficial Great Saphenous Vein: No evidence of thrombus. Normal compressibility and flow on color Doppler imaging. Other Findings:  Edema IMPRESSION: Sonographic survey of the bilateral lower extremities negative for DVT. Bilateral lower extremity edema Electronically Signed   By: Gilmer Mor D.O.  On: 01/23/2018 10:26     Scheduled Meds: Continuous Infusions: . albumin human    .  ceFAZolin (ANCEF) IV Stopped (01/24/18 0617)  . vancomycin 1,500 mg (01/24/18 1008)     LOS: 2 days    Time spent: 30 minutes    Pratik Hoover Brunette, DO [facility] Pager [phone]  If 7PM-7AM, please contact night-coverage [email or web address] Password [facility] 01/24/2018, 11:32 AM

## 2018-01-25 ENCOUNTER — Inpatient Hospital Stay (HOSPITAL_COMMUNITY): Payer: PRIVATE HEALTH INSURANCE

## 2018-01-25 DIAGNOSIS — D684 Acquired coagulation factor deficiency: Secondary | ICD-10-CM

## 2018-01-25 DIAGNOSIS — K838 Other specified diseases of biliary tract: Secondary | ICD-10-CM

## 2018-01-25 DIAGNOSIS — E43 Unspecified severe protein-calorie malnutrition: Secondary | ICD-10-CM

## 2018-01-25 LAB — BASIC METABOLIC PANEL
Anion gap: 8 (ref 5–15)
BUN: 7 mg/dL (ref 6–20)
CHLORIDE: 100 mmol/L — AB (ref 101–111)
CO2: 24 mmol/L (ref 22–32)
CREATININE: 0.5 mg/dL — AB (ref 0.61–1.24)
Calcium: 7.7 mg/dL — ABNORMAL LOW (ref 8.9–10.3)
GFR calc Af Amer: >60 mL/min (ref >60)
Glucose, Bld: 89 mg/dL (ref 65–99)
POTASSIUM: 3.6 mmol/L (ref 3.5–5.1)
SODIUM: 132 mmol/L — AB (ref 135–145)

## 2018-01-25 LAB — CBC WITH DIFFERENTIAL/PLATELET
BASOS ABS: 0.2 10*3/uL — AB (ref 0.0–0.1)
BASOS PCT: 1 %
EOS ABS: 0.5 10*3/uL (ref 0.0–0.7)
Eosinophils Relative: 3 %
HCT: 30.1 % — ABNORMAL LOW (ref 39.0–52.0)
HEMOGLOBIN: 10.2 g/dL — AB (ref 13.0–17.0)
LYMPHS ABS: 1.4 10*3/uL (ref 0.7–4.0)
Lymphocytes Relative: 8 %
MCH: 29.6 pg (ref 26.0–34.0)
MCHC: 33.9 g/dL (ref 30.0–36.0)
MCV: 87.2 fL (ref 78.0–100.0)
MONO ABS: 1.6 10*3/uL — AB (ref 0.1–1.0)
Monocytes Relative: 9 %
NEUTROS ABS: 14 10*3/uL — AB (ref 1.7–7.7)
Neutrophils Relative %: 79 %
Platelets: 411 10*3/uL — ABNORMAL HIGH (ref 150–400)
RBC: 3.45 MIL/uL — ABNORMAL LOW (ref 4.22–5.81)
RDW: 17.3 % — AB (ref 11.5–15.5)
WBC: 17.7 10*3/uL — ABNORMAL HIGH (ref 4.0–10.5)

## 2018-01-25 LAB — HEPATIC FUNCTION PANEL
ALBUMIN: 1.3 g/dL — AB (ref 3.5–5.0)
ALK PHOS: 374 U/L — AB (ref 38–126)
ALT: 66 U/L — ABNORMAL HIGH (ref 17–63)
AST: 142 U/L — ABNORMAL HIGH (ref 15–41)
BILIRUBIN INDIRECT: 6.3 mg/dL — AB (ref 0.3–0.9)
Bilirubin, Direct: 8.6 mg/dL — ABNORMAL HIGH (ref 0.1–0.5)
TOTAL PROTEIN: 7.2 g/dL (ref 6.5–8.1)
Total Bilirubin: 14.9 mg/dL — ABNORMAL HIGH (ref 0.3–1.2)

## 2018-01-25 MED ORDER — DOXYCYCLINE HYCLATE 100 MG PO TABS
100.0000 mg | ORAL_TABLET | Freq: Two times a day (BID) | ORAL | Status: DC
  Administered 2018-01-25 – 2018-01-30 (×11): 100 mg via ORAL
  Filled 2018-01-25 (×11): qty 1

## 2018-01-25 MED ORDER — CEPHALEXIN 500 MG PO CAPS
500.0000 mg | ORAL_CAPSULE | Freq: Three times a day (TID) | ORAL | Status: DC
  Administered 2018-01-25 – 2018-01-30 (×16): 500 mg via ORAL
  Filled 2018-01-25 (×16): qty 1

## 2018-01-25 NOTE — ED Provider Notes (Signed)
[facility] MEDICAL SURGICAL UNIT Provider Note   CSN: [id number] Arrival date & time: 01/22/18  1937     History   Chief Complaint No chief complaint on file.   HPI Reginald Richardson is a 42 y.o. male.  Patient complains of swelling and tenderness to the left lower leg.  He has had swelling to both legs for quite a while but the left one has become red and tender  The history is provided by the patient.  Illness  This is a new problem. The current episode started more than 2 days ago. The problem occurs constantly. The problem has not changed since onset.Pertinent negatives include no chest pain, no abdominal pain and no headaches. Nothing aggravates the symptoms. Nothing relieves the symptoms. He has tried nothing for the symptoms. The treatment provided no relief.    Past Medical History:  Diagnosis Date  . Cirrhosis (HCC) 2019   presented with decompensated cirrhosis    Patient Active Problem List   Diagnosis Date Noted  . Ascites   . Hepatic cirrhosis (HCC)   . Cellulitis of left foot 01/22/2018  . Anemia 01/22/2018  . Hyponatremia 01/22/2018  . Obstructive jaundice 01/22/2018  . 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 arm surgery Right    age 59         Home Medications    Prior to Admission medications   Medication Sig Start Date End Date Taking? Authorizing Provider  spironolactone (ALDACTONE) 25 MG tablet Take 1 tablet (25 mg total) by mouth daily. 01/07/18  Yes Mancel Bale, MD    Family History Family History  Problem Relation Age of Onset  . Hypertension Maternal Grandmother   . Diabetes Maternal Grandmother   . Cancer Maternal Grandmother   . Hypertension Maternal Aunt   . Diabetes Maternal Aunt   . Liver disease Neg Hx   . Colon cancer Neg Hx   . Colon polyps Neg Hx     Social History Social History   Tobacco Use  . Smoking status: Never Smoker  .  Smokeless tobacco: Never Used  Substance Use Topics  . Alcohol use: Never    Frequency: Never  . Drug use: Never    Comment: Occasionally drinks tea. Drink a soda occasionally.     Allergies   Patient has no known allergies.   Review of Systems Review of Systems  Constitutional: Negative for appetite change and fatigue.  HENT: Negative for congestion, ear discharge and sinus pressure.   Eyes: Negative for discharge.  Respiratory: Negative for cough.   Cardiovascular: Negative for chest pain.  Gastrointestinal: Negative for abdominal pain and diarrhea.  Genitourinary: Negative for frequency and hematuria.  Musculoskeletal: Negative for back pain.       Bilateral swollen leg with tenderness to left  Skin: Negative for rash.  Neurological: Negative for seizures and headaches.  Psychiatric/Behavioral: Negative for hallucinations.     Physical Exam Updated Vital Signs BP 130/84 (BP Location: Left Arm)   Pulse 97   Temp 98.2 F (36.8 C) (Oral)   Resp 18   Ht 6' (1.829 m)   Wt 86.1 kg (189 lb 13.1 oz)   SpO2 100%   BMI 25.74 kg/m   Physical Exam  Constitutional: He is oriented to person, place, and time. He appears well-developed.  HENT:  Head: Normocephalic.  Eyes: Conjunctivae and EOM are normal. No scleral icterus.  Neck: Neck supple.  No thyromegaly present.  Cardiovascular: Normal rate and regular rhythm. Exam reveals no gallop and no friction rub.  No murmur heard. Pulmonary/Chest: No stridor. He has no wheezes. He has no rales. He exhibits no tenderness.  Abdominal: He exhibits no distension. There is no tenderness. There is no rebound.  Musculoskeletal: Normal range of motion. He exhibits no edema.  Patient with redness and tenderness to left lower leg consistent with cellulitis  Lymphadenopathy:    He has no cervical adenopathy.  Neurological: He is oriented to person, place, and time. He exhibits normal muscle tone. Coordination normal.  Skin: No rash  noted. No erythema.  Psychiatric: He has a normal mood and affect. His behavior is normal.     ED Treatments / Results  Labs (all labs ordered are listed, but only abnormal results are displayed) Labs Reviewed  CBC WITH DIFFERENTIAL/PLATELET - Abnormal; Notable for the following components:      Result Value   WBC 19.7 (*)    RBC 3.71 (*)    Hemoglobin 11.2 (*)    HCT 32.3 (*)    RDW 17.6 (*)    Platelets 403 (*)    All other components within normal limits  COMPREHENSIVE METABOLIC PANEL - Abnormal; Notable for the following components:   Sodium 132 (*)    Chloride 98 (*)    Glucose, Bld 146 (*)    Calcium 8.0 (*)    Albumin 1.4 (*)    AST 158 (*)    ALT 76 (*)    Alkaline Phosphatase 389 (*)    Total Bilirubin 15.0 (*)    All other components within normal limits  URINALYSIS, ROUTINE W REFLEX MICROSCOPIC - Abnormal; Notable for the following components:   Color, Urine AMBER (*)    APPearance HAZY (*)    Hgb urine dipstick MODERATE (*)    Bilirubin Urine MODERATE (*)    Bacteria, UA FEW (*)    Squamous Epithelial / LPF 0-5 (*)    All other components within normal limits  ACETAMINOPHEN LEVEL - Abnormal; Notable for the following components:   Acetaminophen (Tylenol), Serum <10 (*)    All other components within normal limits  PROTIME-INR - Abnormal; Notable for the following components:   Prothrombin Time 62.4 (*)    INR 7.38 (*)    All other components within normal limits  BILIRUBIN, DIRECT - Abnormal; Notable for the following components:   Bilirubin, Direct 8.0 (*)    All other components within normal limits  CBC WITH DIFFERENTIAL/PLATELET - Abnormal; Notable for the following components:   WBC 16.8 (*)    RBC 3.34 (*)    Hemoglobin 10.0 (*)    HCT 28.8 (*)    RDW 17.7 (*)    Neutro Abs 13.1 (*)    Monocytes Absolute 2.0 (*)    All other components within normal limits  HEPATIC FUNCTION PANEL - Abnormal; Notable for the following components:   Albumin 1.2  (*)    AST 140 (*)    ALT 66 (*)    Alkaline Phosphatase 347 (*)    Total Bilirubin 13.6 (*)    Bilirubin, Direct 7.4 (*)    Indirect Bilirubin 6.2 (*)    All other components within normal limits  BASIC METABOLIC PANEL - Abnormal; Notable for the following components:   Sodium 130 (*)    Chloride 98 (*)    Creatinine, Ser 0.59 (*)    Calcium 7.6 (*)    All other components within  normal limits  CEA - Abnormal; Notable for the following components:   CEA 9.9 (*)    All other components within normal limits  VITAMIN B12 - Abnormal; Notable for the following components:   Vitamin B-12 1,076 (*)    All other components within normal limits  IRON AND TIBC - Abnormal; Notable for the following components:   TIBC 242 (*)    All other components within normal limits  RETICULOCYTES - Abnormal; Notable for the following components:   Retic Ct Pct 4.0 (*)    RBC. 3.34 (*)    All other components within normal limits  PROTIME-INR - Abnormal; Notable for the following components:   Prothrombin Time 18.3 (*)    All other components within normal limits  MITOCHONDRIAL ANTIBODIES - Abnormal; Notable for the following components:   Mitochondrial M2 Ab, IgG 32.5 (*)    All other components within normal limits  ANTI-SMOOTH MUSCLE ANTIBODY, IGG - Abnormal; Notable for the following components:   F-Actin IgG 26 (*)    All other components within normal limits  IGG, IGA, IGM - Abnormal; Notable for the following components:   IgG (Immunoglobin G), Serum 2,714 (*)    IgA 922 (*)    All other components within normal limits  CERULOPLASMIN - Abnormal; Notable for the following components:   Ceruloplasmin 55.5 (*)    All other components within normal limits  HEPATITIS A ANTIBODY, TOTAL - Abnormal; Notable for the following components:   Hep A Total Ab Positive (*)    All other components within normal limits  CBC WITH DIFFERENTIAL/PLATELET - Abnormal; Notable for the following components:    WBC 13.6 (*)    RBC 3.37 (*)    Hemoglobin 10.1 (*)    HCT 29.4 (*)    RDW 17.4 (*)    Neutro Abs 10.3 (*)    Monocytes Absolute 1.4 (*)    All other components within normal limits  HEPATIC FUNCTION PANEL - Abnormal; Notable for the following components:   Albumin 1.2 (*)    AST 141 (*)    ALT 64 (*)    Alkaline Phosphatase 341 (*)    Total Bilirubin 14.2 (*)    Bilirubin, Direct 8.2 (*)    Indirect Bilirubin 6.0 (*)    All other components within normal limits  BASIC METABOLIC PANEL - Abnormal; Notable for the following components:   Sodium 132 (*)    Creatinine, Ser 0.46 (*)    Calcium 7.6 (*)    All other components within normal limits  PROTIME-INR - Abnormal; Notable for the following components:   Prothrombin Time 17.5 (*)    All other components within normal limits  CBC WITH DIFFERENTIAL/PLATELET - Abnormal; Notable for the following components:   WBC 17.7 (*)    RBC 3.45 (*)    Hemoglobin 10.2 (*)    HCT 30.1 (*)    RDW 17.3 (*)    Platelets 411 (*)    Neutro Abs 14.0 (*)    Monocytes Absolute 1.6 (*)    Basophils Absolute 0.2 (*)    All other components within normal limits  HEPATIC FUNCTION PANEL - Abnormal; Notable for the following components:   Albumin 1.3 (*)    AST 142 (*)    ALT 66 (*)    Alkaline Phosphatase 374 (*)    Total Bilirubin 14.9 (*)    Bilirubin, Direct 8.6 (*)    Indirect Bilirubin 6.3 (*)    All other components within  normal limits  BASIC METABOLIC PANEL - Abnormal; Notable for the following components:   Sodium 132 (*)    Chloride 100 (*)    Creatinine, Ser 0.50 (*)    Calcium 7.7 (*)    All other components within normal limits  CULTURE, BLOOD (ROUTINE X 2)  CULTURE, BLOOD (ROUTINE X 2)  MRSA PCR SCREENING  LACTIC ACID, PLASMA  ETHANOL  PHOSPHORUS  MAGNESIUM  HEPATITIS PANEL, ACUTE  HIV ANTIBODY (ROUTINE TESTING)  FERRITIN  FOLATE  AFP TUMOR MARKER  ANTINUCLEAR ANTIBODIES, IFA  HEPATITIS B SURFACE ANTIBODY  HEPATITIS  B CORE ANTIBODY, TOTAL    EKG None  Radiology Mr Liver W Wo Contrast  Result Date: 01/23/2018 CLINICAL DATA:  Bilateral leg and foot swelling and pain. Abnormal CT of the liver. EXAM: MRI ABDOMEN WITHOUT AND WITH CONTRAST TECHNIQUE: Multiplanar multisequence MR imaging of the abdomen was performed both before and after the administration of intravenous contrast. CONTRAST:  17mL MULTIHANCE GADOBENATE DIMEGLUMINE 529 MG/ML IV SOLN COMPARISON:  CT 01/22/2018 FINDINGS: Lower chest: No acute findings. Hepatobiliary: Sub optimal arterial phase and portal venous phase enhancement. The liver has a nodular contour and there is hypertrophy of the lateral segment of left lobe of liver. Within segment 7 wedge-shaped area of relative decreased T1 signal with retraction of the liver capsule compatible with focal confluent fibrosis, image 24/series 4004. Mild-to-moderate diffuse and irregular intrahepatic biliary dilatation. Some of the dilated biliary radicles have a beaded appearance, image 253. Increased periportal T2 signal identified. No arterial phase enhancing lesions identified. On the 5 minutes delayed images there are no focal areas of washout identified. Gallbladder unremarkable. Mild intrahepatic bile duct dilatation measures up to 7 mm. No choledocholithiasis identified. Pancreas: No mass, inflammatory changes, or other parenchymal abnormality identified. Spleen: The spleen measures 10 x 6.3 by 13 cm (volume = 430 cm^3). Adrenals/Urinary Tract: The adrenal glands are normal. Unremarkable appearance of the kidneys. Stomach/Bowel: Visualized portions within the abdomen are unremarkable. Vascular/Lymphatic: Normal appearance the abdominal aorta. The main portal vein is patent. The abdominal aorta is normal in appearance. No aneurysm. Diffuse esophageal and gastric varices identified. The main portal vein and its branches appear patent. Enlarged peripancreatic lymph node measures 1.8 cm, image 50/5006. Other:  There is a large volume of ascites within the abdomen and pelvis. Musculoskeletal: No abnormal signal from within the bone marrow. IMPRESSION: 1. Morphologic features of liver compatible with cirrhosis. There is stigmata of portal venous hypertension including mild splenomegaly, varicosities and ascites. 2. Intrahepatic biliary dilatation. Some intrahepatic bile ducts have a beaded appearance which is suggestive of primary biliary cirrhosis. Correlation with tissue sampling is advised. 3. No focal enhancing liver abnormalities to suggest hepatoma. Area of focal confluent fibrosis is identified within segment 7 of the liver. Electronically Signed   By: Signa Kellaylor  Stroud M.D.   On: 01/23/2018 08:48   [place] Venous Img Lower Bilateral  Result Date: 01/23/2018 CLINICAL DATA:  42 year old male with a history of leg swelling/edema EXAM: BILATERAL LOWER EXTREMITY VENOUS DOPPLER ULTRASOUND TECHNIQUE: Gray-scale sonography with graded compression, as well as color Doppler and duplex ultrasound were performed to evaluate the lower extremity deep venous systems from the level of the common femoral vein and including the common femoral, femoral, profunda femoral, popliteal and calf veins including the posterior tibial, peroneal and gastrocnemius veins when visible. The superficial great saphenous vein was also interrogated. Spectral Doppler was utilized to evaluate flow at rest and with distal augmentation maneuvers in the common femoral, femoral and  popliteal veins. COMPARISON:  None. FINDINGS: RIGHT LOWER EXTREMITY Common Femoral Vein: No evidence of thrombus. Normal compressibility, respiratory phasicity and response to augmentation. Saphenofemoral Junction: No evidence of thrombus. Normal compressibility and flow on color Doppler imaging. Profunda Femoral Vein: No evidence of thrombus. Normal compressibility and flow on color Doppler imaging. Femoral Vein: No evidence of thrombus. Normal compressibility, respiratory phasicity  and response to augmentation. Popliteal Vein: No evidence of thrombus. Normal compressibility, respiratory phasicity and response to augmentation. Calf Veins: No evidence of thrombus. Normal compressibility and flow on color Doppler imaging. Superficial Great Saphenous Vein: No evidence of thrombus. Normal compressibility and flow on color Doppler imaging. Other Findings:  Edema LEFT LOWER EXTREMITY Common Femoral Vein: No evidence of thrombus. Normal compressibility, respiratory phasicity and response to augmentation. Saphenofemoral Junction: No evidence of thrombus. Normal compressibility and flow on color Doppler imaging. Profunda Femoral Vein: No evidence of thrombus. Normal compressibility and flow on color Doppler imaging. Femoral Vein: No evidence of thrombus. Normal compressibility, respiratory phasicity and response to augmentation. Popliteal Vein: No evidence of thrombus. Normal compressibility, respiratory phasicity and response to augmentation. Calf Veins: No evidence of thrombus. Normal compressibility and flow on color Doppler imaging. Superficial Great Saphenous Vein: No evidence of thrombus. Normal compressibility and flow on color Doppler imaging. Other Findings:  Edema IMPRESSION: Sonographic survey of the bilateral lower extremities negative for DVT. Bilateral lower extremity edema Electronically Signed   By: Gilmer Mor D.O.   On: 01/23/2018 10:26   [place] Ascites (abdomen Limited)  Result Date: 01/24/2018 CLINICAL DATA:  Cirrhosis, ascites EXAM: LIMITED ABDOMEN ULTRASOUND FOR ASCITES TECHNIQUE: Limited ultrasound survey for ascites was performed in all four abdominal quadrants. COMPARISON:  CT abdomen 01/22/2018 FINDINGS: Minimal scattered ascites throughout the abdomen. Visualized ascites is insufficient for paracentesis. IMPRESSION: Insufficient ascites for paracentesis. Electronically Signed   By: Ulyses Southward M.D.   On: 01/24/2018 11:40    Procedures Procedures (including critical care  time)  Medications Ordered in ED Medications  ondansetron (ZOFRAN) tablet 4 mg (has no administration in time range)    Or  ondansetron (ZOFRAN) injection 4 mg (has no administration in time range)  oxyCODONE (Oxy IR/ROXICODONE) immediate release tablet 5 mg (has no administration in time range)  hydrOXYzine (ATARAX/VISTARIL) tablet 25 mg (has no administration in time range)  vancomycin (VANCOCIN) 1,500 mg in sodium chloride 0.9 % 500 mL IVPB (0 mg Intravenous Stopped 01/24/18 2350)  ceFAZolin (ANCEF) IVPB 1 g/50 mL premix (0 g Intravenous Stopped 01/25/18 0620)  albumin human 25 % solution 25 g (25 g Intravenous Not Given 01/24/18 1212)  furosemide (LASIX) tablet 40 mg (has no administration in time range)  spironolactone (ALDACTONE) tablet 100 mg (has no administration in time range)  vancomycin (VANCOCIN) IVPB 1000 mg/200 mL premix (0 mg Intravenous Stopped 01/22/18 2225)  phytonadione (VITAMIN K) 10 mg in dextrose 5 % 50 mL IVPB (0 mg Intravenous Stopped 01/23/18 0057)  iopamidol (ISOVUE-300) 61 % injection 100 mL (100 mLs Intravenous Contrast Given 01/22/18 2305)  gadobenate dimeglumine (MULTIHANCE) injection 20 mL (17 mLs Intravenous Contrast Given 01/23/18 0737)  furosemide (LASIX) tablet 20 mg (20 mg Oral Given 01/24/18 2121)  spironolactone (ALDACTONE) tablet 50 mg (50 mg Oral Given 01/24/18 2120)     Initial Impression / Assessment and Plan / ED Course  I have reviewed the triage vital signs and the nursing notes.  Pertinent labs & imaging results that were available during my care of the patient were reviewed by me and considered  in my medical decision making (see chart for details).     Include cellulitis to left lower leg with treatment for antibiotic  Final Clinical Impressions(s) / ED Diagnoses   Final diagnoses:  Cellulitis and abscess of foot    ED Discharge Orders    None       Bethann Berkshire, MD 01/25/18 [phone]

## 2018-01-25 NOTE — Progress Notes (Signed)
Initial Nutrition Assessment  DOCUMENTATION CODES:   Severe malnutrition in context of chronic illness   INTERVENTION:  Provided Nutrition Therapy Education 2 gr Sodium diet / Cirrhosis  Small frequent meals, low sodium foods.   Snacks between meals at 1000, 1400 and 2000 daily\  Add MVI daily  NUTRITION DIAGNOSIS:  Severe malnutrition in the context of advanced chronic liver disease as evidenced by moderate-severe muscle fat depletions as well as severe wt gain 12% < 1 month.  Severe Malnutrition related to chronic illness(advanced chronic liver disease (MELD Score-24) ) as evidenced by severe muscle depletion, severe fat depletion, edema.  GOAL:   Patient will meet greater than or equal to 90% of their needs  MONITOR:   PO intake, Weight trends, Labs  REASON FOR ASSESSMENT:   Consult Assessment of nutrition requirement/status, Diet education(2 gr sodium)  ASSESSMENT:   The patient is a 42 yo male who presents with cellulitis of left foot, anemia, hyponatremia, ascites. Newly identified advanced chronic liver disease (MELD Score-24) per GI will be referred for follow up related to potential liver transplant.   Patient says he has not been salting his food. His intake is sporadic at times due to his work flow. He affirms a good appetite. His meal intake since admission between 75-100%. We talked about the importance of limiting his sodium,  and increasing the frequency of his meals. Provided list of recommended and not recommended foods. Handouts were Cirrhosis and Low Sodium Nutrition therapy diet  recommendations were reviewed and time for questions allowed. RD contact also provided.  He is at risk for multiple nutrient deficiencies including B 12, B 6, folate, niacin, thiamine, zinc, magnesium, calcium, phosphorus, potassium and iron. May also need a water soluble form of Vitamin A, D, E and K.  His weight was 170 lb on 3/30 during ED visit with 3+ edema to lower  extremities . Current wt shows and increase of 20 lb (12%) in < 1 month.    Labs:  BMP Latest Ref Rng & Units 01/25/2018 01/24/2018 01/23/2018  Glucose 65 - 99 mg/dL 89 79 89  BUN 6 - 20 mg/dL 7 8 9   Creatinine 0.61 - 1.24 mg/dL 1.61(W) 9.60(A) 5.40(J)  Sodium 135 - 145 mmol/L 132(L) 132(L) 130(L)  Potassium 3.5 - 5.1 mmol/L 3.6 3.8 3.7  Chloride 101 - 111 mmol/L 100(L) 101 98(L)  CO2 22 - 32 mmol/L 24 23 23   Calcium 8.9 - 10.3 mg/dL 7.7(L) 7.6(L) 7.6(L)    Meds: . cephALEXin  500 mg Oral Q8H  . doxycycline  100 mg Oral Q12H  . furosemide  40 mg Oral Q breakfast  . spironolactone  100 mg Oral Q breakfast    Past Medical History:  Diagnosis Date  . Cirrhosis (HCC) 2019   presented with decompensated cirrhosis     NUTRITION - FOCUSED PHYSICAL EXAM:    Most Recent Value  Orbital Region  Severe depletion  Upper Arm Region  Moderate depletion  Thoracic and Lumbar Region  Moderate depletion  Buccal Region  Severe depletion  Temple Region  Moderate depletion  Clavicle Bone Region  Severe depletion  Clavicle and Acromion Bone Region  Severe depletion  Scapular Bone Region  Moderate depletion  Dorsal Hand  Severe depletion  Edema (RD Assessment)  Severe [pitting edema BLE]  Hair  Reviewed  Eyes  Unable to assess  Mouth  Reviewed  Skin  Unable to assess  Nails  Unable to assess     Diet Order:  Diet 2 gram sodium Room service appropriate? Yes; Fluid consistency: Thin  EDUCATION NEEDS:   Education needs have been addressed(Low sodium Nutrition Therapy completed)  Skin:  Skin Assessment: Reviewed RN Assessment  Last BM:  4/14  Height:   Ht Readings from Last 1 Encounters:  01/22/18 6' (1.829 m)    Weight:   Wt Readings from Last 1 Encounters:  01/22/18 189 lb 13.1 oz (86.1 kg)    Ideal Body Weight:  81 kg  BMI:  Body mass index is 25.74 kg/m.  Estimated Nutritional Needs:   Kcal:  2240-2400(Based on 80 kg due to pt significant wt gain)  Protein:  104-120  gr   Fluid:  per MD goals  Royann ShiversLynn Sairah Knobloch MS,RD,CSG,LDN Office: [phone] Pager: [phone]

## 2018-01-25 NOTE — Progress Notes (Signed)
PROGRESS NOTE    Reginald BushyShaun Mccahill  FAO:[id number] DOB: 05/23/1976 DOA: 01/22/2018 PCP: Patient, No Pcp Per   Brief Narrative:  Reginald Richardson is a 42 y.o. male with no previous medical history who is coming to the emergency department due to lower extremity swelling for 2 weeks and 2 days of left foot pain/worsening swelling. He has had chronic jaundice and skin lesions for a lot of months or almost a year, according to his mother.  He has frequent pruritus and has multiple skin abrasions from scratching.  He recently started having occasional bleeding gums.  He has been admitted with bilateral lower extremity cellulitis and has been started on vancomycin and Zosyn.  He also appears to have findings of obstructive jaundice for which workup is currently pending with consultation to GI.   Assessment & Plan:   1. Left lower extremity cellulitis:  No fever, WBC's trending down appropriately and no signs of systemic infection. Still with ongoing swelling, most likely associated with 3rd space shifting. Will transition to oral antibiotics (using doxycycline and Keflex).  Will follow clinical response.  Will keep legs elevated, continue as needed analgesics. 2. Obstructive jaundice with cirrhosis.  LFTs stable/slightly appreciate assistance from gastroenterology service and will follow recommendations. Not enough ascitic fluid for paracentesis.  Planning MRCP and in near future will most likely required liver biopsy.  Continue Lasix and spironolactone. 3. 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  INR is back to within normal limits after vitamin K no signs of acute bleeding..  4. Anemia.  Stable.  Continue intermittent CBC to follow hemoglobin trend.  5. Hyponatremia.  Likely secondary to diarrhea, overall decreased oral intake and dilutional effect.  To assess lites trend Lasix and spironolactone.  Patient's cirrhosis most likely contributing with hyponatremia as well. 6. Severe  protein calorie malnutrition: follow nutritional service recommendations for feeding supplements.     DVT prophylaxis:SCDs Code Status: Full Family Communication: Mother and Aunt at bedside. Disposition Plan: Follow GI service recommendation, transition antibiotics to p.o., follow electrolytes and LFTs trend.  Patient clinically improved.   Consultants:   GI  Procedures:   Bilateral Doppler ultrasound of lower extremities without DVT.  Antimicrobials:   Vancomycin and Zosyn 4/14->4/15  Vancomycin and Ancef 4/15->4/17  Doxycycline and keflex 4/17   Subjective: Afebrile, no chest pain, no shortness of breath.  Patient denies any nausea vomiting and reported feeling somewhat better. Still with signs of 3rd space shifting and fluid overload.  Objective: Vitals:   01/24/18 1440 01/24/18 2053 01/25/18 0448 01/25/18 1420  BP: 102/64 121/67 130/84 (!) 127/46  Pulse: (!) 109 91 97 (!) 108  Resp: 20 18 18 20   Temp: 98.7 F (37.1 C) 98.6 F (37 C) 98.2 F (36.8 C) 99.2 F (37.3 C)  TempSrc: Oral Oral Oral Oral  SpO2: 99% 98% 100% 100%  Weight:      Height:        Intake/Output Summary (Last 24 hours) at 01/25/2018 1525 Last data filed at 01/25/2018 1500 Gross per 24 hour  Intake 960 ml  Output 4750 ml  Net -3790 ml   Filed Weights   01/22/18 1944 01/22/18 2348  Weight: 77.1 kg (170 lb) 86.1 kg (189 lb 13.1 oz)    Examination:  General exam: Afebrile, in no acute distress, denies chest pain and shortness of breath.  Patient is still with positive jaundice and reporting leg swelling and pain. Respiratory system: Good air movement bilaterally, no wheezing, no crackles, no using  accessory muscles. Cardiovascular system: S1 and S2, no rubs, no gallops no JVD.  No appreciated murmur on exam.  2+ edema bilaterally (left more than right). Gastrointestinal system: Soft, nontender, positive bowel sounds, no guarding, no rebound.   Central nervous system: Alert, awake and  oriented x3, cranial nerves grossly intact, no focal deficit. Extremities: 2+ edema appreciated bilaterally, erythema, no open wound no drainage. Skin: Patient with erythema as mentioned above affecting bilateral feet and especially left lower extremity shin and calf; no open wounds, no drainage. Psychiatry: Good judgment, good insight, stable mood.  No hallucinations.   Data Reviewed: I have personally reviewed following labs and imaging studies  CBC: Recent Labs  Lab 01/22/18 2031 01/23/18 0547 01/24/18 0533 01/25/18 0535  WBC 19.7* 16.8* 13.6* 17.7*  NEUTROABS 16.8 13.1* 10.3* 14.0*  HGB 11.2* 10.0* 10.1* 10.2*  HCT 32.3* 28.8* 29.4* 30.1*  MCV 87.1 86.2 87.2 87.2  PLT 403* 368 372 411*   Basic Metabolic Panel: Recent Labs  Lab 01/22/18 2031 01/23/18 0547 01/24/18 0533 01/25/18 0535  NA 132* 130* 132* 132*  K 4.0 3.7 3.8 3.6  CL 98* 98* 101 100*  CO2 24 23 23 24   GLUCOSE 146* 89 79 89  BUN 10 9 8 7   CREATININE 0.64 0.59* 0.46* 0.50*  CALCIUM 8.0* 7.6* 7.6* 7.7*  MG 1.9  --   --   --   PHOS 3.4  --   --   --    GFR: Estimated Creatinine Clearance: 136.1 mL/min (A) (by C-G formula based on SCr of 0.5 mg/dL (L)). Liver Function Tests: Recent Labs  Lab 01/22/18 2031 01/23/18 0547 01/24/18 0533 01/25/18 0535  AST 158* 140* 141* 142*  ALT 76* 66* 64* 66*  ALKPHOS 389* 347* 341* 374*  BILITOT 15.0* 13.6* 14.2* 14.9*  PROT 7.5 6.7 6.7 7.2  ALBUMIN 1.4* 1.2* 1.2* 1.3*   No results for input(s): LIPASE, AMYLASE in the last 168 hours. No results for input(s): AMMONIA in the last 168 hours. Coagulation Profile: Recent Labs  Lab 01/22/18 2031 01/23/18 1151 01/24/18 0533  INR 7.38* 1.53 1.45   Cardiac Enzymes: No results for input(s): CKTOTAL, CKMB, CKMBINDEX, TROPONINI in the last 168 hours. BNP (last 3 results) No results for input(s): PROBNP in the last 8760 hours. HbA1C: No results for input(s): HGBA1C in the last 72 hours. CBG: No results for  input(s): GLUCAP in the last 168 hours. Lipid Profile: No results for input(s): CHOL, HDL, LDLCALC, TRIG, CHOLHDL, LDLDIRECT in the last 72 hours. Thyroid Function Tests: No results for input(s): TSH, T4TOTAL, FREET4, T3FREE, THYROIDAB in the last 72 hours. Anemia Panel: Recent Labs    01/23/18 0547  VITAMINB12 1,076*  FOLATE 19.8  FERRITIN 48  TIBC 242*  IRON 77  RETICCTPCT 4.0*   Sepsis Labs: Recent Labs  Lab 01/22/18 2031  LATICACIDVEN 1.8    Recent Results (from the past 240 hour(s))  Blood culture (routine x 2)     Status: None (Preliminary result)   Collection Time: 01/22/18  8:31 PM  Result Value Ref Range Status   Specimen Description BLOOD RIGHT FOREARM  Final   Special Requests   Final    BOTTLES DRAWN AEROBIC AND ANAEROBIC Blood Culture adequate volume   Culture   Final    NO GROWTH 3 DAYS Performed at [facility], [address] [place], [place] [address]    Report Status PENDING  Incomplete  Blood culture (routine x 2)     Status: None (  Preliminary result)   Collection Time: 01/22/18  8:43 PM  Result Value Ref Range Status   Specimen Description BLOOD LEFT FOREARM  Final   Special Requests   Final    BOTTLES DRAWN AEROBIC ONLY Blood Culture adequate volume   Culture   Final    NO GROWTH 3 DAYS Performed at [facility], [address], [place], [place] [address]    Report Status PENDING  Incomplete  MRSA PCR Screening     Status: None   Collection Time: 01/24/18  7:06 AM  Result Value Ref Range Status   MRSA by PCR NEGATIVE NEGATIVE Final    Comment:        The GeneXpert MRSA Assay (FDA approved for NASAL specimens only), is one component of a comprehensive MRSA colonization surveillance program. It is not intended to diagnose MRSA infection nor to guide or monitor treatment for MRSA infections. Performed at [facility], [address] [place], [place] [address]          Radiology Studies: [place] Ascites (abdomen Limited)  Result Date:  01/24/2018 CLINICAL DATA:  Cirrhosis, ascites EXAM: LIMITED ABDOMEN ULTRASOUND FOR ASCITES TECHNIQUE: Limited ultrasound survey for ascites was performed in all four abdominal quadrants. COMPARISON:  CT abdomen 01/22/2018 FINDINGS: Minimal scattered ascites throughout the abdomen. Visualized ascites is insufficient for paracentesis. IMPRESSION: Insufficient ascites for paracentesis. Electronically Signed   By: Ulyses Southward M.D.   On: 01/24/2018 11:40     Scheduled Meds: . cephALEXin  500 mg Oral Q8H  . doxycycline  100 mg Oral Q12H  . furosemide  40 mg Oral Q breakfast  . spironolactone  100 mg Oral Q breakfast   Continuous Infusions:    LOS: 3 days    Time spent: 30 minutes    Vassie Loll, MD [facility] Pager [phone]  If 7PM-7AM, please contact night-coverage [email or web address] Password [facility] 01/25/2018, 3:25 PM

## 2018-01-25 NOTE — Evaluation (Signed)
Physical Therapy Evaluation Patient Details Name: Reginald Richardson MRN: [id number] DOB: 03/20/1976 Today's Date: 01/25/2018   History of Present Illness  Reginald Richardson is a 42 y.o. male with no previous medical history who is coming to the emergency department due to lower extremity swelling for 2 weeks and 2 days of left foot pain/worsening swelling.  He noticed having swelling about 2 weeks ago and came to the ED.  Since then, he was using compression wraps on his legs and was improving until Friday when he noticed that his left foot was swelling and developing discomfort.  Since then, the swelling has increased and the tenderness has increased in intensity.  The area has also developed erythema.  On Saturday, the tenderness got very intense and he had shakes and chills for a short period.  He denies night sweats.  He denies trauma to the area.  He has had chronic jaundice and skin lesions for a lot of months or almost a year, according to his mother.  He has frequent pruritus and has multiple skin abrasions from scratching.  He recently started having occasional bleeding gums.  He does not have a PCP and has not establish since he was seen in the ED on 01/07/2018.  He denies headache, rhinorrhea, sore throat, dyspnea, hemoptysis, cough, chest pain, palpitations, dizziness, PND, orthopnea or pitting edema of the lower extremities.  He denies abdominal pain, nausea, vomiting, constipation, melena or hematochezia.  He has been getting soft to mildly loose stools BMs recently.  He states that his urine looks very dark, but denies dysuria or frequency.  No polyphagia, polydipsia or polyuria.    Clinical Impression  Patient apprehensive to get up due fear of increasing left foot pain, agreeable after encouragement and limited to a few steps at bedside due to increased left foot pain when in dependent position or weight bearing, instructed patient to limit body weight on left foot using RW with mild improvement in  pain and patient declined to sit in chair and put back to bed with LLE elevated.  Patient will benefit from continued physical therapy in hospital and recommended venue below to increase strength, balance, endurance for safe ADLs and gait.    Follow Up Recommendations Outpatient PT    Equipment Recommendations  Rolling walker with 5" wheels    Recommendations for Other Services       Precautions / Restrictions Precautions Precautions: Fall Restrictions Weight Bearing Restrictions: No      Mobility  Bed Mobility Overal bed mobility: Modified Independent                Transfers Overall transfer level: Needs assistance Equipment used: Rolling walker (2 wheeled) Transfers: Sit to/from Stand;Stand Pivot Transfers Sit to Stand: Min guard Stand pivot transfers: Min guard       General transfer comment: poor tolerance for weightbearing on LLE  Ambulation/Gait Ambulation/Gait assistance: Min assist Ambulation Distance (Feet): 5 Feet Assistive device: Rolling walker (2 wheeled) Gait Pattern/deviations: Decreased step length - right;Decreased step length - left;Decreased stance time - left;Decreased stride length Gait velocity: decreased   General Gait Details: limited to 8-10 tiny steps with slow labored movement and poor tolerance for weightbearing on left foot due to c/o severe pain when in dependent position and when weighbearing  Stairs            Wheelchair Mobility    Modified Rankin (Stroke Patients Only)       Balance Overall balance assessment: Needs assistance Sitting-balance support: Feet  supported;No upper extremity supported Sitting balance-Leahy Scale: Good     Standing balance support: Bilateral upper extremity supported;During functional activity Standing balance-Leahy Scale: Fair                               Pertinent Vitals/Pain Pain Assessment: 0-10 Pain Score: 9  Pain Location: left foot when in dependent position or  weightbearing Pain Descriptors / Indicators: Burning;Sharp;Grimacing;Guarding Pain Intervention(s): Limited activity within patient's tolerance;Monitored during session    Home Living Family/patient expects to be discharged to:: Private residence Living Arrangements: Alone Available Help at Discharge: Family Type of Home: House Home Access: Stairs to enter Entrance Stairs-Rails: Left Entrance Stairs-Number of Steps: 3 Home Layout: One level Home Equipment: None      Prior Function Level of Independence: Independent         Comments: community ambulator, drives, works     Higher education careers adviser        Extremity/Trunk Assessment   Upper Extremity Assessment Upper Extremity Assessment: Overall WFL for tasks assessed    Lower Extremity Assessment Lower Extremity Assessment: Generalized weakness;LLE deficits/detail RLE Deficits / Details: grossly 5/5 LLE Deficits / Details: grossly -3/5 except right foot/ankle not tested due to c/o severe pain    Cervical / Trunk Assessment Cervical / Trunk Assessment: Normal  Communication   Communication: No difficulties  Cognition Arousal/Alertness: Awake/alert Behavior During Therapy: WFL for tasks assessed/performed Overall Cognitive Status: Within Functional Limits for tasks assessed                                        General Comments      Exercises     Assessment/Plan    PT Assessment Patient needs continued PT services  PT Problem List Decreased strength;Decreased activity tolerance;Decreased balance;Decreased mobility;Decreased range of motion;Pain(decreased ROM left ankle/foot due to pain)       PT Treatment Interventions Gait training;Functional mobility training;Therapeutic activities;Stair training;Therapeutic exercise;Patient/family education    PT Goals (Current goals can be found in the Care Plan section)  Acute Rehab PT Goals Patient Stated Goal: return home with less left foot pain PT Goal  Formulation: With patient Time For Goal Achievement: 02/01/18 Potential to Achieve Goals: Good    Frequency Min 3X/week   Barriers to discharge        Co-evaluation               AM-PAC PT "6 Clicks" Daily Activity  Outcome Measure Difficulty turning over in bed (including adjusting bedclothes, sheets and blankets)?: None Difficulty moving from lying on back to sitting on the side of the bed? : None Difficulty sitting down on and standing up from a chair with arms (e.g., wheelchair, bedside commode, etc,.)?: A Little Help needed moving to and from a bed to chair (including a wheelchair)?: A Little Help needed walking in hospital room?: A Lot Help needed climbing 3-5 steps with a railing? : Total 6 Click Score: 17    End of Session   Activity Tolerance: Patient limited by fatigue;Patient limited by pain Patient left: in bed;with call bell/phone within reach Nurse Communication: Mobility status PT Visit Diagnosis: Unsteadiness on feet (R26.81);Other abnormalities of gait and mobility (R26.89);Muscle weakness (generalized) (M62.81)    Time: 1610-9604 PT Time Calculation (min) (ACUTE ONLY): 28 min   Charges:   PT Evaluation $PT Eval Moderate Complexity: 1 Mod  PT Treatments $Therapeutic Activity: 23-37 mins   PT G Codes:        3:05 PM, 22-Feb-2018 Ocie Bob, MPT Physical Therapist with [facility] 336 [phone] office [phone] mobile phone

## 2018-01-25 NOTE — Plan of Care (Signed)
  Problem: Acute Rehab PT Goals(only PT should resolve) Goal: Patient Will Transfer Sit To/From Stand Outcome: Progressing Flowsheets (Taken 01/25/2018 1507) Patient will transfer sit to/from stand: with supervision Goal: Pt Will Transfer Bed To Chair/Chair To Bed Outcome: Progressing Flowsheets (Taken 01/25/2018 1507) Pt will Transfer Bed to Chair/Chair to Bed: with supervision Goal: Pt Will Ambulate Outcome: Progressing Flowsheets (Taken 01/25/2018 1507) Pt will Ambulate: 50 feet;with supervision;with rolling walker;with crutches   3:08 PM, 01/25/18 Ocie BobJames Latresa Gasser, MPT Physical Therapist with [facility] [phone] office [phone] mobile phone

## 2018-01-25 NOTE — Plan of Care (Signed)
Progressing

## 2018-01-25 NOTE — Care Management Note (Signed)
Case Management Note  Patient Details  Name: Reginald Richardson MRN: [id number] Date of Birth: 11/21/1975  Expected Discharge Date:  01/25/18               Expected Discharge Plan:  Home/Self Care  In-House Referral:  NA  Discharge planning Services  CM Consult  Post Acute Care Choice:  Durable Medical Equipment Choice offered to:  Parent  DME Arranged:  Dan HumphreysWalker rolling DME Agency:  [facility]  Status of Service:  Completed, signed off  If discussed at Long Length of Stay Meetings, dates discussed:    Additional Comments: Pt recommends OP PT and RW. Pt in procedure and CM spoke with [facility] who is still [facility] Pt currently in MRI. She would like RW delivered to pt room, has no preference of DME provider. [facility] aware and will deliver DME to pt room. Mother asks that a OP PT referral be made and they will decide after he is DC'd.  CM will make referral to [place] facility at their request.   [name], [name], RN 01/25/2018, 3:28 PM

## 2018-01-25 NOTE — Progress Notes (Addendum)
Subjective:  Patient complains of left leg pain only. Feels like swelling and pain slightly improved in the upper portion of the left leg but unchanged at the foot.   Objective: Vital signs in last 24 hours: Temp:  [98.2 F (36.8 C)-98.7 F (37.1 C)] 98.2 F (36.8 C) (04/17 0448) Pulse Rate:  [91-109] 97 (04/17 0448) Resp:  [18-20] 18 (04/17 0448) BP: (102-130)/(64-84) 130/84 (04/17 0448) SpO2:  [98 %-100 %] 100 % (04/17 0448) Last BM Date: 01/22/18 General:   Alert,  Well-developed, well-nourished, pleasant and cooperative in NAD. Mother at bedside.  Head:  Normocephalic and atraumatic. Eyes:  Sclera clear, + icterus.  Abdomen:  Soft, tender and slight distention. Normal bowel sounds, without guarding, and without rebound.   Extremities:  Left lower ext erythema to just below the knee with marked pedal edema/erythema. LLE edema greater than right. Without clubbing, deformity or edema. Neurologic:  Alert and  oriented x4;  grossly normal neurologically. Skin:  Intact without significant lesions or rashes. Psych:  Alert and cooperative. Normal mood and affect.  Intake/Output from previous day: 04/16 0701 - 04/17 0700 In: 1390 [P.O.:840; IV Piggyback:550] Out: 3900 [Urine:3900] Intake/Output this shift: No intake/output data recorded.  Lab Results: CBC Recent Labs    01/23/18 0547 01/24/18 0533 01/25/18 0535  WBC 16.8* 13.6* 17.7*  HGB 10.0* 10.1* 10.2*  HCT 28.8* 29.4* 30.1*  MCV 86.2 87.2 87.2  PLT 368 372 411*   BMET Recent Labs    01/23/18 0547 01/24/18 0533 01/25/18 0535  NA 130* 132* 132*  K 3.7 3.8 3.6  CL 98* 101 100*  CO2 23 23 24   GLUCOSE 89 79 89  BUN 9 8 7   CREATININE 0.59* 0.46* 0.50*  CALCIUM 7.6* 7.6* 7.7*   LFTs Recent Labs    01/23/18 0547 01/24/18 0533 01/25/18 0535  BILITOT 13.6* 14.2* 14.9*  BILIDIR 7.4* 8.2* 8.6*  IBILI 6.2* 6.0* 6.3*  ALKPHOS 347* 341* 374*  AST 140* 141* 142*  ALT 66* 64* 66*  PROT 6.7 6.7 7.2  ALBUMIN 1.2*  1.2* 1.3*   No results for input(s): LIPASE in the last 72 hours. PT/INR Recent Labs    01/22/18 2031 01/23/18 1151 01/24/18 0533  LABPROT 62.4* 18.3* 17.5*  INR 7.38* 1.53 1.45      Imaging Studies: Dg Chest 2 View  Result Date: 01/07/2018 CLINICAL DATA:  Bilateral leg swelling. EXAM: CHEST - 2 VIEW COMPARISON:  None. FINDINGS: Atelectasis in the right lung base and right middle lobe. The heart, hila, mediastinum, lungs, and pleura are otherwise normal. IMPRESSION: Atelectasis on the right.  No other acute abnormalities. Electronically Signed   By: Gerome Samavid  Williams III M.D   On: 01/07/2018 11:48   Ct Abdomen Pelvis W Contrast  Result Date: 01/22/2018 CLINICAL DATA:  Subacute onset of bilateral leg and foot swelling and pain. EXAM: CT ABDOMEN AND PELVIS WITH CONTRAST TECHNIQUE: Multidetector CT imaging of the abdomen and pelvis was performed using the standard protocol following bolus administration of intravenous contrast. CONTRAST:  100mL ISOVUE-300 IOPAMIDOL (ISOVUE-300) INJECTION 61% COMPARISON:  None. FINDINGS: Lower chest: The visualized lung bases are grossly clear. The visualized portions of the mediastinum are unremarkable. Hepatobiliary: There is a nodular contour to the liver, reflecting hepatic cirrhosis. Several masslike areas are noted within the liver, measuring 6.6 cm at the hepatic hilum and 4.8 cm at the left hepatic lobe. Malignancy cannot be excluded. The gallbladder is grossly unremarkable, though difficult to fully assess given surrounding ascites. The common  bile duct is normal in caliber. Pancreas: The pancreas is within normal limits. Spleen: The spleen is unremarkable in appearance. Adrenals/Urinary Tract: The adrenal glands are unremarkable in appearance. A nonobstructing 4 mm stone is noted at the upper pole of the left kidney. There is no evidence of hydronephrosis. No obstructing ureteral stones are identified. No perinephric stranding is seen. Stomach/Bowel: Varices  are noted about the distal esophagus and stomach. The stomach is unremarkable in appearance. The small bowel is within normal limits. The appendix is normal in caliber, without evidence of appendicitis. There is vague edema involving the wall of the colon, likely reflecting underlying hypoalbuminemia. The colon is otherwise unremarkable. Vascular/Lymphatic: The abdominal aorta is unremarkable in appearance. The inferior vena cava is grossly unremarkable. No retroperitoneal lymphadenopathy is seen. No pelvic sidewall lymphadenopathy is identified. Reproductive: The bladder is mildly distended and grossly unremarkable. The prostate is normal in size. Other: Small volume ascites is noted within the abdomen and pelvis. Mildly prominent varices are seen tracking along the anterior abdominal wall and within the omentum. Musculoskeletal: No acute osseous abnormalities are identified. The visualized musculature is unremarkable in appearance. IMPRESSION: 1. Several masslike areas noted within the liver, measuring 6.6 cm at the hepatic hilum and 4.8 cm at the left hepatic lobe. Malignancy cannot be excluded. Dynamic liver protocol MRI or CT would be helpful for further evaluation. 2. Findings of hepatic cirrhosis. Small volume ascites noted within the abdomen and pelvis. 3. 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 Varices noted about the distal esophagus and stomach. 4. Vague edema involving the wall of the colon likely reflects underlying hypoalbuminemia. 5. Nonobstructing 4 mm stone at the upper pole of the left kidney. Electronically Signed   By: Roanna Raider M.D.   On: 01/22/2018 23:24   Mr Liver W Wo Contrast  Result Date: 01/23/2018 CLINICAL DATA:  Bilateral leg and foot swelling and pain. Abnormal CT of the liver. EXAM: MRI ABDOMEN WITHOUT AND WITH CONTRAST TECHNIQUE: Multiplanar multisequence MR imaging of the abdomen was performed both before and after the  administration of intravenous contrast. CONTRAST:  17mL MULTIHANCE GADOBENATE DIMEGLUMINE 529 MG/ML IV SOLN COMPARISON:  CT 01/22/2018 FINDINGS: Lower chest: No acute findings. Hepatobiliary: Sub optimal arterial phase and portal venous phase enhancement. The liver has a nodular contour and there is hypertrophy of the lateral segment of left lobe of liver. Within segment 7 wedge-shaped area of relative decreased T1 signal with retraction of the liver capsule compatible with focal confluent fibrosis, image 24/series 4004. Mild-to-moderate diffuse and irregular intrahepatic biliary dilatation. Some of the dilated biliary radicles have a beaded appearance, image 253. Increased periportal T2 signal identified. No arterial phase enhancing lesions identified. On the 5 minutes delayed images there are no focal areas of washout identified. Gallbladder unremarkable. Mild intrahepatic bile duct dilatation measures up to 7 mm. No choledocholithiasis identified. Pancreas: No mass, inflammatory changes, or other parenchymal abnormality identified. Spleen: The spleen measures 10 x 6.3 by 13 cm (volume = 430 cm^3). Adrenals/Urinary Tract: The adrenal glands are normal. Unremarkable appearance of the kidneys. Stomach/Bowel: Visualized portions within the abdomen are unremarkable. Vascular/Lymphatic: Normal appearance the abdominal aorta. The main portal vein is patent. The abdominal aorta is normal in appearance. No aneurysm. Diffuse esophageal and gastric varices identified. The main portal vein and its branches appear patent. Enlarged peripancreatic lymph node measures 1.8 cm, image 50/5006. Other: There is a large volume of ascites within the abdomen and pelvis. Musculoskeletal: No  abnormal signal from within the bone marrow. IMPRESSION: 1. Morphologic features of liver compatible with cirrhosis. There is stigmata of portal venous hypertension including mild splenomegaly, varicosities and ascites. 2. Intrahepatic biliary  dilatation. Some intrahepatic bile ducts have a beaded appearance which is suggestive of primary biliary cirrhosis. Correlation with tissue sampling is advised. 3. No focal enhancing liver abnormalities to suggest hepatoma. Area of focal confluent fibrosis is identified within segment 7 of the liver. Electronically Signed   By: Signa Kell M.D.   On: 01/23/2018 08:48   US Venous Img Lower Bilateral  Result Date: 01/23/2018 CLINICAL DATA:  42 year old male with a history of leg swelling/edema EXAM: BILATERAL LOWER EXTREMITY VENOUS DOPPLER ULTRASOUND TECHNIQUE: Gray-scale sonography with graded compression, as well as color Doppler and duplex ultrasound were performed to evaluate the lower extremity deep venous systems from the level of the common femoral vein and including the common femoral, femoral, profunda femoral, popliteal and calf veins including the posterior tibial, peroneal and gastrocnemius veins when visible. The superficial great saphenous vein was also interrogated. Spectral Doppler was utilized to evaluate flow at rest and with distal augmentation maneuvers in the common femoral, femoral and popliteal veins. COMPARISON:  None. FINDINGS: RIGHT LOWER EXTREMITY Common Femoral Vein: No evidence of thrombus. Normal compressibility, respiratory phasicity and response to augmentation. Saphenofemoral Junction: No evidence of thrombus. Normal compressibility and flow on color Doppler imaging. Profunda Femoral Vein: No evidence of thrombus. Normal compressibility and flow on color Doppler imaging. Femoral Vein: No evidence of thrombus. Normal compressibility, respiratory phasicity and response to augmentation. Popliteal Vein: No evidence of thrombus. Normal compressibility, respiratory phasicity and response to augmentation. Calf Veins: No evidence of thrombus. Normal compressibility and flow on color Doppler imaging. Superficial Great Saphenous Vein: No evidence of thrombus. Normal compressibility and  flow on color Doppler imaging. Other Findings:  Edema LEFT LOWER EXTREMITY Common Femoral Vein: No evidence of thrombus. Normal compressibility, respiratory phasicity and response to augmentation. Saphenofemoral Junction: No evidence of thrombus. Normal compressibility and flow on color Doppler imaging. Profunda Femoral Vein: No evidence of thrombus. Normal compressibility and flow on color Doppler imaging. Femoral Vein: No evidence of thrombus. Normal compressibility, respiratory phasicity and response to augmentation. Popliteal Vein: No evidence of thrombus. Normal compressibility, respiratory phasicity and response to augmentation. Calf Veins: No evidence of thrombus. Normal compressibility and flow on color Doppler imaging. Superficial Great Saphenous Vein: No evidence of thrombus. Normal compressibility and flow on color Doppler imaging. Other Findings:  Edema IMPRESSION: Sonographic survey of the bilateral lower extremities negative for DVT. Bilateral lower extremity edema Electronically Signed   By: Gilmer Mor D.O.   On: 01/23/2018 10:26   [place] Ascites (abdomen Limited)  Result Date: 01/24/2018 CLINICAL DATA:  Cirrhosis, ascites EXAM: LIMITED ABDOMEN ULTRASOUND FOR ASCITES TECHNIQUE: Limited ultrasound survey for ascites was performed in all four abdominal quadrants. COMPARISON:  CT abdomen 01/22/2018 FINDINGS: Minimal scattered ascites throughout the abdomen. Visualized ascites is insufficient for paracentesis. IMPRESSION: Insufficient ascites for paracentesis. Electronically Signed   By: Ulyses Southward M.D.   On: 01/24/2018 11:40  [2 weeks]   Assessment:  42 year old male admitted with lower extremity cellulitis and new findings of advanced chronic liver disease, suspicious for PSC. MRI liver completed due to concern for hepatic masses, but no mass appreciated. MELD Na 24 yesterday, coagulopathy improved, LFTs remaining overall stable. No evidence for encephalopathy.  Thus far, acute hepatitis panel  negative, IgG significantly elevated, ceruloplasmin elevated but this is acute phase reactant. ANA negative,  ASMA weakly positive, AMA positive. AFP tumor marker normal. No evidence of iron overload.   CEA is abnormally elevated. 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 No overt GI bleeding. Will also need EGD to assess esophageal varices at time of colonoscopy.   Ascites: insufficient ascites for paracentesis on u/s 01/24/18. No concern for SBP.   Cellulitis: per attending, increased leukocytosis with mild left shift. Mother interested in transfer to [facility] for left leg edema/cellulitis.  Cirrhosis: will need transplant center referral regardless of etiology   Plan: 1. Daily weights.  2. Nutrition consult as planned.  3. Outpatient MRCP to further evaluate intrahepatic ducts, ?PSC. 4. Colonoscopy/EGD as outpatient.  5. Referral to liver transplant facility as outpatient, may likely need liver biopsy as well. Could be seen in [place] facility for [facility] [place] 6. Advised patient/mother to discuss concerns for transfer for management of cellulitis with attending. Will make nurse aware of request.   Leanna Battles. Dixon Boos [facility] [phone] 4/17/20198:59 AM     LOS: 3 days    Addendum: discussed with Dr. Jena Gauss. Inpatient MRCP planned.   Leanna Battles. Dixon Boos [facility] [phone] 4/17/201912:51 PM

## 2018-01-26 ENCOUNTER — Telehealth (HOSPITAL_COMMUNITY): Payer: Self-pay | Admitting: General Practice

## 2018-01-26 DIAGNOSIS — R945 Abnormal results of liver function studies: Secondary | ICD-10-CM

## 2018-01-26 DIAGNOSIS — E43 Unspecified severe protein-calorie malnutrition: Secondary | ICD-10-CM

## 2018-01-26 DIAGNOSIS — R7989 Other specified abnormal findings of blood chemistry: Secondary | ICD-10-CM

## 2018-01-26 LAB — CBC WITH DIFFERENTIAL/PLATELET
BASOS PCT: 1 %
Basophils Absolute: 0.2 10*3/uL — ABNORMAL HIGH (ref 0.0–0.1)
EOS PCT: 3 %
Eosinophils Absolute: 0.6 10*3/uL (ref 0.0–0.7)
HEMATOCRIT: 30.7 % — AB (ref 39.0–52.0)
HEMOGLOBIN: 10.3 g/dL — AB (ref 13.0–17.0)
LYMPHS PCT: 8 %
Lymphs Abs: 1.6 10*3/uL (ref 0.7–4.0)
MCH: 29.7 pg (ref 26.0–34.0)
MCHC: 33.6 g/dL (ref 30.0–36.0)
MCV: 88.5 fL (ref 78.0–100.0)
Monocytes Absolute: 1.8 10*3/uL — ABNORMAL HIGH (ref 0.1–1.0)
Monocytes Relative: 9 %
NEUTROS PCT: 79 %
Neutro Abs: 16.2 10*3/uL — ABNORMAL HIGH (ref 1.7–7.7)
Platelets: 457 10*3/uL — ABNORMAL HIGH (ref 150–400)
RBC: 3.47 MIL/uL — ABNORMAL LOW (ref 4.22–5.81)
RDW: 17.3 % — ABNORMAL HIGH (ref 11.5–15.5)
WBC: 20.4 10*3/uL — ABNORMAL HIGH (ref 4.0–10.5)

## 2018-01-26 LAB — HEPATIC FUNCTION PANEL
ALK PHOS: 382 U/L — AB (ref 38–126)
ALT: 62 U/L (ref 17–63)
AST: 144 U/L — AB (ref 15–41)
Albumin: 1.2 g/dL — ABNORMAL LOW (ref 3.5–5.0)
BILIRUBIN DIRECT: 8.1 mg/dL — AB (ref 0.1–0.5)
BILIRUBIN TOTAL: 14.3 mg/dL — AB (ref 0.3–1.2)
Indirect Bilirubin: 6.2 mg/dL — ABNORMAL HIGH (ref 0.3–0.9)
Total Protein: 7.1 g/dL (ref 6.5–8.1)

## 2018-01-26 MED ORDER — ALBUMIN HUMAN 25 % IV SOLN
25.0000 g | Freq: Three times a day (TID) | INTRAVENOUS | Status: AC
  Administered 2018-01-26 – 2018-01-28 (×6): 25 g via INTRAVENOUS
  Filled 2018-01-26: qty 100
  Filled 2018-01-26: qty 50
  Filled 2018-01-26 (×6): qty 100

## 2018-01-26 MED ORDER — FUROSEMIDE 10 MG/ML IJ SOLN
20.0000 mg | Freq: Three times a day (TID) | INTRAMUSCULAR | Status: AC
  Administered 2018-01-26 – 2018-01-28 (×6): 20 mg via INTRAVENOUS
  Filled 2018-01-26 (×6): qty 2

## 2018-01-26 NOTE — Progress Notes (Addendum)
Subjective:  Frustrated. Leg still painful. Can't walk on it. No abd pain, vomiting.   Objective: Vital signs in last 24 hours: Temp:  [97.9 F (36.6 C)-100 F (37.8 C)] 97.9 F (36.6 C) (04/18 0420) Pulse Rate:  [96-108] 106 (04/18 0420) Resp:  [18-20] 18 (04/18 0420) BP: (123-127)/(46-84) 127/84 (04/18 0420) SpO2:  [95 %-100 %] 100 % (04/18 0420) Weight:  [203 lb 4.2 oz (92.2 kg)] 203 lb 4.2 oz (92.2 kg) (04/18 0420) Last BM Date: 01/22/18 General:   Alert,  Well-developed, well-nourished, pleasant and cooperative in NAD Head:  Normocephalic and atraumatic. Eyes:  Sclera clear, + icterus. Abdomen:  Soft, nontender slight distention.  Normal bowel sounds, without guarding, and without rebound.   Extremities:  Left LE with erythema to the knee with marked pedal edema/erythema. LLE greater than right.  Neurologic:  Alert and  oriented x4;  grossly normal neurologically. Skin:  Intact without significant lesions or rashes. Psych:  Alert and cooperative. Normal mood and affect.  Intake/Output from previous day: 04/17 0701 - 04/18 0700 In: 1320 [P.O.:1320] Out: 4000 [Urine:4000] Intake/Output this shift: No intake/output data recorded.  Lab Results: CBC Recent Labs    01/24/18 0533 01/25/18 0535 01/26/18 0422  WBC 13.6* 17.7* 20.4*  HGB 10.1* 10.2* 10.3*  HCT 29.4* 30.1* 30.7*  MCV 87.2 87.2 88.5  PLT 372 411* 457*   BMET Recent Labs    01/24/18 0533 01/25/18 0535  NA 132* 132*  K 3.8 3.6  CL 101 100*  CO2 23 24  GLUCOSE 79 89  BUN 8 7  CREATININE 0.46* 0.50*  CALCIUM 7.6* 7.7*   LFTs Recent Labs    01/24/18 0533 01/25/18 0535 01/26/18 0422  BILITOT 14.2* 14.9* 14.3*  BILIDIR 8.2* 8.6* 8.1*  IBILI 6.0* 6.3* 6.2*  ALKPHOS 341* 374* 382*  AST 141* 142* 144*  ALT 64* 66* 62  PROT 6.7 7.2 7.1  ALBUMIN 1.2* 1.3* 1.2*   No results for input(s): LIPASE in the last 72 hours. PT/INR Recent Labs    01/23/18 1151 01/24/18 0533  LABPROT 18.3* 17.5*   INR 1.53 1.45      Imaging Studies: Dg Chest 2 View  Result Date: 01/07/2018 CLINICAL DATA:  Bilateral leg swelling. EXAM: CHEST - 2 VIEW COMPARISON:  None. FINDINGS: Atelectasis in the right lung base and right middle lobe. The heart, hila, mediastinum, lungs, and pleura are otherwise normal. IMPRESSION: Atelectasis on the right.  No other acute abnormalities. Electronically Signed   By: Gerome Sam III M.D   On: 01/07/2018 11:48   Ct Abdomen Pelvis W Contrast  Result Date: 01/22/2018 CLINICAL DATA:  Subacute onset of bilateral leg and foot swelling and pain. EXAM: CT ABDOMEN AND PELVIS WITH CONTRAST TECHNIQUE: Multidetector CT imaging of the abdomen and pelvis was performed using the standard protocol following bolus administration of intravenous contrast. CONTRAST:  ISOVUE-300 IOPAMIDOL (ISOVUE-300) INJECTION 61% COMPARISON:  None. FINDINGS: Lower chest: The visualized lung bases are grossly clear. The visualized portions of the mediastinum are unremarkable. Hepatobiliary: There is a nodular contour to the liver, reflecting hepatic cirrhosis. Several masslike areas are noted within the liver, measuring 6.6 cm at the hepatic hilum and 4.8 cm at the left hepatic lobe. Malignancy cannot be excluded. The gallbladder is grossly unremarkable, though difficult to fully assess given surrounding ascites. The common bile duct is normal in caliber. Pancreas: The pancreas is within normal limits. Spleen: The spleen is unremarkable in appearance. Adrenals/Urinary Tract: The adrenal glands are unremarkable  in appearance. A nonobstructing 4 mm stone is noted at the upper pole of the left kidney. There is no evidence of hydronephrosis. No obstructing ureteral stones are identified. No perinephric stranding is seen. Stomach/Bowel: Varices are noted about the distal esophagus and stomach. The stomach is unremarkable in appearance. The small bowel is within normal limits. The appendix is normal in caliber,  without evidence of appendicitis. There is vague edema involving the wall of the colon, likely reflecting underlying hypoalbuminemia. The colon is otherwise unremarkable. Vascular/Lymphatic: The abdominal aorta is unremarkable in appearance. The inferior vena cava is grossly unremarkable. No retroperitoneal lymphadenopathy is seen. No pelvic sidewall lymphadenopathy is identified. Reproductive: The bladder is mildly distended and grossly unremarkable. The prostate is normal in size. Other: Small volume ascites is noted within the abdomen and pelvis. Mildly prominent varices are seen tracking along the anterior abdominal wall and within the omentum. Musculoskeletal: No acute osseous abnormalities are identified. The visualized musculature is unremarkable in appearance. IMPRESSION: 1. Several masslike areas noted within the liver, measuring 6.6 cm at the hepatic hilum and 4.8 cm at the left hepatic lobe. Malignancy cannot be excluded. Dynamic liver protocol MRI or CT would be helpful for further evaluation. 2. Findings of hepatic cirrhosis. Small volume ascites noted within the abdomen and pelvis. 3. 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 Varices noted about the distal esophagus and stomach. 4. Vague edema involving the wall of the colon likely reflects underlying hypoalbuminemia. 5. Nonobstructing 4 mm stone at the upper pole of the left kidney. Electronically Signed   By: Roanna RaiderJeffery  Chang M.D.   On: 01/22/2018 23:24   Mr Liver W Wo Contrast  Result Date: 01/23/2018 CLINICAL DATA:  Bilateral leg and foot swelling and pain. Abnormal CT of the liver. EXAM: MRI ABDOMEN WITHOUT AND WITH CONTRAST TECHNIQUE: Multiplanar multisequence MR imaging of the abdomen was performed both before and after the administration of intravenous contrast. CONTRAST:  17mL MULTIHANCE GADOBENATE DIMEGLUMINE 529 MG/ML IV SOLN COMPARISON:  CT 01/22/2018 FINDINGS: Lower chest: No acute  findings. Hepatobiliary: Sub optimal arterial phase and portal venous phase enhancement. The liver has a nodular contour and there is hypertrophy of the lateral segment of left lobe of liver. Within segment 7 wedge-shaped area of relative decreased T1 signal with retraction of the liver capsule compatible with focal confluent fibrosis, image 24/series 4004. Mild-to-moderate diffuse and irregular intrahepatic biliary dilatation. Some of the dilated biliary radicles have a beaded appearance, image 253. Increased periportal T2 signal identified. No arterial phase enhancing lesions identified. On the 5 minutes delayed images there are no focal areas of washout identified. Gallbladder unremarkable. Mild intrahepatic bile duct dilatation measures up to 7 mm. No choledocholithiasis identified. Pancreas: No mass, inflammatory changes, or other parenchymal abnormality identified. Spleen: The spleen measures 10 x 6.3 by 13 cm (volume = 430 cm^3). Adrenals/Urinary Tract: The adrenal glands are normal. Unremarkable appearance of the kidneys. Stomach/Bowel: Visualized portions within the abdomen are unremarkable. Vascular/Lymphatic: Normal appearance the abdominal aorta. The main portal vein is patent. The abdominal aorta is normal in appearance. No aneurysm. Diffuse esophageal and gastric varices identified. The main portal vein and its branches appear patent. Enlarged peripancreatic lymph node measures 1.8 cm, image 50/5006. Other: There is a large volume of ascites within the abdomen and pelvis. Musculoskeletal: No abnormal signal from within the bone marrow. IMPRESSION: 1. Morphologic features of liver compatible with cirrhosis. There is stigmata of portal venous hypertension including mild splenomegaly, varicosities  and ascites. 2. Intrahepatic biliary dilatation. Some intrahepatic bile ducts have a beaded appearance which is suggestive of primary biliary cirrhosis. Correlation with tissue sampling is advised. 3. No focal  enhancing liver abnormalities to suggest hepatoma. Area of focal confluent fibrosis is identified within segment 7 of the liver. Electronically Signed   By: Signa Kell M.D.   On: 01/23/2018 08:48   Mr Abdomen Mrcp Wo Contrast  Result Date: 01/25/2018 CLINICAL DATA:  Decompensated cirrhosis. Abnormal liver function tests. Bilirubin equal 14 EXAM: MRI ABDOMEN WITHOUT CONTRAST  (INCLUDING MRCP) TECHNIQUE: Multiplanar multisequence MR imaging of the abdomen was performed. Heavily T2-weighted images of the biliary and pancreatic ducts were obtained, and three-dimensional MRCP images were rendered by post processing. COMPARISON:  MRI abdomen 01/23/2018, CT 01/22/2018 FINDINGS: Exam is severely degraded by patient motion. Lower chest:  Lung bases are clear. Hepatobiliary: Morphologic changes in liver consistent cirrhosis again noted. Common hepatic duct and common bile duct are distended to a similar degree to comparison MRI. Difficult to measure but the common hepatic duct measures 12 mm (image 26/5) and the common bile duct measures approximately 8 mm. No filling defects within ducts noted. There is duct dilatation segmentally within the RIGHT hepatic lobe (segment 8). No additional intrahepatic duct dilatation. These findings are not changed. The gallbladder is elongated with mild gallbladder wall thickening related to ascites. Pancreas: Normal pancreatic parenchymal intensity. No ductal dilatation or inflammation. Spleen: Splenomegaly Adrenals/urinary tract: Adrenal glands and kidneys are normal. Stomach/Bowel: Stomach and limited of the small bowel is unremarkable Vascular/Lymphatic: Abdominal aortic normal caliber. No retroperitoneal periportal lymphadenopathy. Musculoskeletal: No aggressive osseous lesion IMPRESSION: 1. Exam is severely degraded by patient motion. 2. No significant change from MRI 2 days prior. 3. Mild to moderate extrahepatic duct dilatation. No significant intrahepatic duct dilatation. No  obstructing lesion identified. 4. Moderate volume ascites. Electronically Signed   By: Genevive Bi M.D.   On: 01/25/2018 17:52   Mr 3d Recon At Scanner  Result Date: 01/25/2018 CLINICAL DATA:  Decompensated cirrhosis. Abnormal liver function tests. Bilirubin equal 14 EXAM: MRI ABDOMEN WITHOUT CONTRAST  (INCLUDING MRCP) TECHNIQUE: Multiplanar multisequence MR imaging of the abdomen was performed. Heavily T2-weighted images of the biliary and pancreatic ducts were obtained, and three-dimensional MRCP images were rendered by post processing. COMPARISON:  MRI abdomen 01/23/2018, CT 01/22/2018 FINDINGS: Exam is severely degraded by patient motion. Lower chest:  Lung bases are clear. Hepatobiliary: Morphologic changes in liver consistent cirrhosis again noted. Common hepatic duct and common bile duct are distended to a similar degree to comparison MRI. Difficult to measure but the common hepatic duct measures 12 mm (image 26/5) and the common bile duct measures approximately 8 mm. No filling defects within ducts noted. There is duct dilatation segmentally within the RIGHT hepatic lobe (segment 8). No additional intrahepatic duct dilatation. These findings are not changed. The gallbladder is elongated with mild gallbladder wall thickening related to ascites. Pancreas: Normal pancreatic parenchymal intensity. No ductal dilatation or inflammation. Spleen: Splenomegaly Adrenals/urinary tract: Adrenal glands and kidneys are normal. Stomach/Bowel: Stomach and limited of the small bowel is unremarkable Vascular/Lymphatic: Abdominal aortic normal caliber. No retroperitoneal periportal lymphadenopathy. Musculoskeletal: No aggressive osseous lesion IMPRESSION: 1. Exam is severely degraded by patient motion. 2. No significant change from MRI 2 days prior. 3. Mild to moderate extrahepatic duct dilatation. No significant intrahepatic duct dilatation. No obstructing lesion identified. 4. Moderate volume ascites. Electronically  Signed   By: Genevive Bi M.D.   On: 01/25/2018 17:52  US Venous Img Lower Bilateral  Result Date: 01/23/2018 CLINICAL DATA:  42 year old male with a history of leg swelling/edema EXAM: BILATERAL LOWER EXTREMITY VENOUS DOPPLER ULTRASOUND TECHNIQUE: Gray-scale sonography with graded compression, as well as color Doppler and duplex ultrasound were performed to evaluate the lower extremity deep venous systems from the level of the common femoral vein and including the common femoral, femoral, profunda femoral, popliteal and calf veins including the posterior tibial, peroneal and gastrocnemius veins when visible. The superficial great saphenous vein was also interrogated. Spectral Doppler was utilized to evaluate flow at rest and with distal augmentation maneuvers in the common femoral, femoral and popliteal veins. COMPARISON:  None. FINDINGS: RIGHT LOWER EXTREMITY Common Femoral Vein: No evidence of thrombus. Normal compressibility, respiratory phasicity and response to augmentation. Saphenofemoral Junction: No evidence of thrombus. Normal compressibility and flow on color Doppler imaging. Profunda Femoral Vein: No evidence of thrombus. Normal compressibility and flow on color Doppler imaging. Femoral Vein: No evidence of thrombus. Normal compressibility, respiratory phasicity and response to augmentation. Popliteal Vein: No evidence of thrombus. Normal compressibility, respiratory phasicity and response to augmentation. Calf Veins: No evidence of thrombus. Normal compressibility and flow on color Doppler imaging. Superficial Great Saphenous Vein: No evidence of thrombus. Normal compressibility and flow on color Doppler imaging. Other Findings:  Edema LEFT LOWER EXTREMITY Common Femoral Vein: No evidence of thrombus. Normal compressibility, respiratory phasicity and response to augmentation. Saphenofemoral Junction: No evidence of thrombus. Normal compressibility and flow on color Doppler imaging. Profunda  Femoral Vein: No evidence of thrombus. Normal compressibility and flow on color Doppler imaging. Femoral Vein: No evidence of thrombus. Normal compressibility, respiratory phasicity and response to augmentation. Popliteal Vein: No evidence of thrombus. Normal compressibility, respiratory phasicity and response to augmentation. Calf Veins: No evidence of thrombus. Normal compressibility and flow on color Doppler imaging. Superficial Great Saphenous Vein: No evidence of thrombus. Normal compressibility and flow on color Doppler imaging. Other Findings:  Edema IMPRESSION: Sonographic survey of the bilateral lower extremities negative for DVT. Bilateral lower extremity edema Electronically Signed   By: Gilmer Mor D.O.   On: 01/23/2018 10:26   [place] Ascites (abdomen Limited)  Result Date: 01/24/2018 CLINICAL DATA:  Cirrhosis, ascites EXAM: LIMITED ABDOMEN ULTRASOUND FOR ASCITES TECHNIQUE: Limited ultrasound survey for ascites was performed in all four abdominal quadrants. COMPARISON:  CT abdomen 01/22/2018 FINDINGS: Minimal scattered ascites throughout the abdomen. Visualized ascites is insufficient for paracentesis. IMPRESSION: Insufficient ascites for paracentesis. Electronically Signed   By: Ulyses Southward M.D.   On: 01/24/2018 11:40  [2 weeks]   Assessment:  42 year old male admitted with lower extremity cellulitis and new findings of advanced chronic liver disease, suspicious for PSC. MRI liver completed due to concern for hepatic masses, but no mass appreciated. MELD Na 24, coagulopathy improved, LFTs remaining overall stable. No evidence for encephalopathy.  Thus far, acute hepatitis panel negative, IgG significantly elevated, ceruloplasmin elevated but this is acute phase reactant. ANA negative, ASMA weakly positive, AMA positive (which is unusual for PSC but cannot rule out overlap syndrome). AFP tumor marker normal. No evidence of iron overload.   MRCP yesterday with distention of the common hepatic  and common bile duct (12 and 8mm respectively). No filling defects in the ducts. There is segmental duct dilatation within the right hepatic lobe. Cirrhosis changes noted.   Unclear how much weight gain since admission (?correct start weight) but at least 14 pounds.   CEA is abnormally elevated. 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 No overt GI bleeding. Will also need EGD to assess esophageal varices at time of colonoscopy.   Ascites: insufficient ascites for paracentesis on u/s 01/24/18. No concern for SBP.   Cellulitis: per attending, increased leukocytosis with mild left shift. WBC continues to increase now over 20,000, with atypical lymphs, stomatocytes, left shift.  Cirrhosis: will need transplant center referral regardless of etiology  Plan: 1. Daily weights, standing scales. First standing weight today 191.  2. Management of cellulitis per attending, increasing WBC concerning.  3. Colonoscopy/egd as outpatient.  4. Liver transplant facility referral. Patient to decide today where he would like to be sent for outpatient evaluation.   Leanna Battles. [facility] [phone] 4/18/20198:34 AM     LOS: 4 days    Discussed management with Dr. Gwenlyn Perking. Discussed with Dr. Jena Gauss. We will begin albumin/lasix runs for 6 doses. Daily electrolytes. Consider liver biopsy Monday if patient agreeable.   Leanna Battles. [facility] [phone] 4/18/20191:26 PM

## 2018-01-26 NOTE — Telephone Encounter (Signed)
01/26/18  I spoke with patient's mom and she said they didn't know when he would be discharged from the hospital.  She thought today but nobody has said anything.  I told her that I would call back on Monday

## 2018-01-26 NOTE — Progress Notes (Signed)
PROGRESS NOTE    Reginald Richardson  ZOX:[id number] DOB: 07/08/1976 DOA: 01/22/2018 PCP: Patient, No Pcp Per   Brief Narrative:  Reginald Richardson is a 42 y.o. male with no previous medical history who is coming to the emergency department due to lower extremity swelling for 2 weeks and 2 days of left foot pain/worsening swelling. He has had chronic jaundice and skin lesions for a lot of months or almost a year, according to his mother.  He has frequent pruritus and has multiple skin abrasions from scratching.  He recently started having occasional bleeding gums.  He has been admitted with bilateral lower extremity cellulitis and has been started on vancomycin and Zosyn.  He also appears to have findings of obstructive jaundice for which workup is currently pending with consultation to GI.   Assessment & Plan:   1. Left lower extremity cellulitis:  No fever, WBC's went slightly up, but patient with some improvement in pain and redness. Will continue PO antibiotics, leg elevation and treatment for 3rd spacing as specified below. Follow clinical response. 2. Obstructive jaundice with cirrhosis.  LFTs stable/slightly decreased. appreciate assistance from gastroenterology service and will follow further recommendations. 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 GI planning for liver biopsy to confirmed diagnosis. Continue lasix and spironolactone. 3. 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  INR was initially elevated with a subsequent return to normal after vitamin K was given.  There is no signs of acute bleeding.  Continue to follow clinical response.  4. Anemia.  Overall stable.  Continue intermittent CBC to follow hemoglobin trend.  No signs of acute bleeding appreciated. 5. Hyponatremia/hypoalbuminemia.  Likely secondary to diarrhea, overall decreased oral intake and dilutional effect.  Plan is to provide albumin  infusion with follow-up with IV Lasix.  Will also continue spironolactone.  Follow further GI recommendations.  6. Severe protein calorie malnutrition: Continue to follow nutritional service recommendation for feeding supplements.      DVT prophylaxis:SCDs Code Status: Full Family Communication: Mother and Aunt at bedside. Disposition Plan: Follow GI service recommendation, transition antibiotics to p.o., follow electrolytes and LFTs trend.  Patient clinically improved.   Consultants:   GI  Procedures:   Bilateral Doppler ultrasound of lower extremities without DVT.  Antimicrobials:   Vancomycin and Zosyn 4/14->4/15  Vancomycin and Ancef 4/15->4/17  Doxycycline and keflex 4/17   Subjective: No fever, no chest pain, no shortness of breath.  Patient reports some improvement in the redness of his left lower extremity, but is still having pain and express some difficulty with ambulation.   Objective: Vitals:   01/25/18 2031 01/26/18 0420 01/26/18 0827 01/26/18 1427  BP: 123/72 127/84  122/90  Pulse: 96 (!) 106  (!) 118  Resp: 18 18  19   Temp: 100 F (37.8 C) 97.9 F (36.6 C)  99.8 F (37.7 C)  TempSrc: Oral Oral  Oral  SpO2: 99% 100%  100%  Weight:  92.2 kg (203 lb 4.2 oz) 86.9 kg (191 lb 8 oz)   Height:        Intake/Output Summary (Last 24 hours) at 01/26/2018 1713 Last data filed at 01/26/2018 1500 Gross per 24 hour  Intake 1320 ml  Output 3900 ml  Net -2580 ml   Filed Weights   01/22/18 2348 01/26/18 0420 01/26/18 0827  Weight: 86.1 kg (189 lb 13.1 oz) 92.2 kg (203 lb 4.2 oz) 86.9 kg (191 lb 8 oz)    Examination:  General exam: No fever, no acute distress, denies chest pain and shortness of breath.  Patient still with positive jaundice and having intermittent discomfort in his left lower extremity.  He reports that he has been able to put a little more weight and in fact expressed decreased in the erythema on his leg.  Respiratory system: Good air movement  bilaterally, no wheezing, no crackles, no using accessory muscles. Cardiovascular system: S1 and S2, no rubs, no gallops, no JVD.  2+ edema bilaterally appreciated (left more than right). Gastrointestinal system: Soft, nontender, positive bowel sounds, no guarding, no rebound. Central nervous system: Alert, awake and oriented x3, cranial nerves grossly intact, no focal deficit. Extremities: 2+ edema appreciated bilaterally, left more than right), erythema improving, even is still present, no cyanosis, no open wounds or drainage. Skin: Patient with some improvement in the erythematous changes associated with left lower extremity cellulitis, demonstrating significant swelling of both of his legs (2+ bilaterally), no open wounds, chronic eczema toes changes appreciated on his feet and lower extremities.   Psychiatry: Good judgment, preserved insight, stable mood, no hallucinations.   Data Reviewed: I have personally reviewed following labs and imaging studies  CBC: Recent Labs  Lab 01/22/18 2031 01/23/18 0547 01/24/18 0533 01/25/18 0535 01/26/18 0422  WBC 19.7* 16.8* 13.6* 17.7* 20.4*  NEUTROABS 16.8 13.1* 10.3* 14.0* 16.2*  HGB 11.2* 10.0* 10.1* 10.2* 10.3*  HCT 32.3* 28.8* 29.4* 30.1* 30.7*  MCV 87.1 86.2 87.2 87.2 88.5  PLT 403* 368 372 411* 457*   Basic Metabolic Panel: Recent Labs  Lab 01/22/18 2031 01/23/18 0547 01/24/18 0533 01/25/18 0535  NA 132* 130* 132* 132*  K 4.0 3.7 3.8 3.6  CL 98* 98* 101 100*  CO2 24 23 23 24   GLUCOSE 146* 89 79 89  BUN 10 9 8 7   CREATININE 0.64 0.59* 0.46* 0.50*  CALCIUM 8.0* 7.6* 7.6* 7.7*  MG 1.9  --   --   --   PHOS 3.4  --   --   --    GFR: Estimated Creatinine Clearance: 136.1 mL/min (A) (by C-G formula based on SCr of 0.5 mg/dL (L)).   Liver Function Tests: Recent Labs  Lab 01/22/18 2031 01/23/18 0547 01/24/18 0533 01/25/18 0535 01/26/18 0422  AST 158* 140* 141* 142* 144*  ALT 76* 66* 64* 66* 62  ALKPHOS 389* 347* 341* 374*  382*  BILITOT 15.0* 13.6* 14.2* 14.9* 14.3*  PROT 7.5 6.7 6.7 7.2 7.1  ALBUMIN 1.4* 1.2* 1.2* 1.3* 1.2*   Coagulation Profile: Recent Labs  Lab 01/22/18 2031 01/23/18 1151 01/24/18 0533  INR 7.38* 1.53 1.45   Sepsis Labs: Recent Labs  Lab 01/22/18 2031  LATICACIDVEN 1.8    Recent Results (from the past 240 hour(s))  Blood culture (routine x 2)     Status: None (Preliminary result)   Collection Time: 01/22/18  8:31 PM  Result Value Ref Range Status   Specimen Description BLOOD RIGHT FOREARM  Final   Special Requests   Final    BOTTLES DRAWN AEROBIC AND ANAEROBIC Blood Culture adequate volume   Culture   Final    NO GROWTH 4 DAYS Performed at [facility], [address] [place], [place] [address]    Report Status PENDING  Incomplete  Blood culture (routine x 2)     Status: None (Preliminary result)   Collection Time: 01/22/18  8:43 PM  Result Value Ref Range Status   Specimen Description BLOOD LEFT FOREARM  Final   Special Requests  Final    BOTTLES DRAWN AEROBIC ONLY Blood Culture adequate volume   Culture   Final    NO GROWTH 4 DAYS Performed at [facility], [address], [place], [place] [address]    Report Status PENDING  Incomplete  MRSA PCR Screening     Status: None   Collection Time: 01/24/18  7:06 AM  Result Value Ref Range Status   MRSA by PCR NEGATIVE NEGATIVE Final    Comment:        The GeneXpert MRSA Assay (FDA approved for NASAL specimens only), is one component of a comprehensive MRSA colonization surveillance program. It is not intended to diagnose MRSA infection nor to guide or monitor treatment for MRSA infections. Performed at [facility], [address], [place], [place] [address]      Radiology Studies: Mr Abdomen Mrcp Wo Contrast  Result Date: 01/25/2018 CLINICAL DATA:  Decompensated cirrhosis. Abnormal liver function tests. Bilirubin equal 14 EXAM: MRI ABDOMEN WITHOUT CONTRAST  (INCLUDING MRCP) TECHNIQUE: Multiplanar  multisequence MR imaging of the abdomen was performed. Heavily T2-weighted images of the biliary and pancreatic ducts were obtained, and three-dimensional MRCP images were rendered by post processing. COMPARISON:  MRI abdomen 01/23/2018, CT 01/22/2018 FINDINGS: Exam is severely degraded by patient motion. Lower chest:  Lung bases are clear. Hepatobiliary: Morphologic changes in liver consistent cirrhosis again noted. Common hepatic duct and common bile duct are distended to a similar degree to comparison MRI. Difficult to measure but the common hepatic duct measures 12 mm (image 26/5) and the common bile duct measures approximately 8 mm. No filling defects within ducts noted. There is duct dilatation segmentally within the RIGHT hepatic lobe (segment 8). No additional intrahepatic duct dilatation. These findings are not changed. The gallbladder is elongated with mild gallbladder wall thickening related to ascites. Pancreas: Normal pancreatic parenchymal intensity. No ductal dilatation or inflammation. Spleen: Splenomegaly Adrenals/urinary tract: Adrenal glands and kidneys are normal. Stomach/Bowel: Stomach and limited of the small bowel is unremarkable Vascular/Lymphatic: Abdominal aortic normal caliber. No retroperitoneal periportal lymphadenopathy. Musculoskeletal: No aggressive osseous lesion IMPRESSION: 1. Exam is severely degraded by patient motion. 2. No significant change from MRI 2 days prior. 3. Mild to moderate extrahepatic duct dilatation. No significant intrahepatic duct dilatation. No obstructing lesion identified. 4. Moderate volume ascites. Electronically Signed   By: Genevive Bi M.D.   On: 01/25/2018 17:52   Mr 3d Recon At Scanner  Result Date: 01/25/2018 CLINICAL DATA:  Decompensated cirrhosis. Abnormal liver function tests. Bilirubin equal 14 EXAM: MRI ABDOMEN WITHOUT CONTRAST  (INCLUDING MRCP) TECHNIQUE: Multiplanar multisequence MR imaging of the abdomen was performed. Heavily  T2-weighted images of the biliary and pancreatic ducts were obtained, and three-dimensional MRCP images were rendered by post processing. COMPARISON:  MRI abdomen 01/23/2018, CT 01/22/2018 FINDINGS: Exam is severely degraded by patient motion. Lower chest:  Lung bases are clear. Hepatobiliary: Morphologic changes in liver consistent cirrhosis again noted. Common hepatic duct and common bile duct are distended to a similar degree to comparison MRI. Difficult to measure but the common hepatic duct measures 12 mm (image 26/5) and the common bile duct measures approximately 8 mm. No filling defects within ducts noted. There is duct dilatation segmentally within the RIGHT hepatic lobe (segment 8). No additional intrahepatic duct dilatation. These findings are not changed. The gallbladder is elongated with mild gallbladder wall thickening related to ascites. Pancreas: Normal pancreatic parenchymal intensity. No ductal dilatation or inflammation. Spleen: Splenomegaly Adrenals/urinary tract: Adrenal glands and kidneys are normal. Stomach/Bowel: Stomach  and limited of the small bowel is unremarkable Vascular/Lymphatic: Abdominal aortic normal caliber. No retroperitoneal periportal lymphadenopathy. Musculoskeletal: No aggressive osseous lesion IMPRESSION: 1. Exam is severely degraded by patient motion. 2. No significant change from MRI 2 days prior. 3. Mild to moderate extrahepatic duct dilatation. No significant intrahepatic duct dilatation. No obstructing lesion identified. 4. Moderate volume ascites. Electronically Signed   By: Genevive Bi M.D.   On: 01/25/2018 17:52     Scheduled Meds: . cephALEXin  500 mg Oral Q8H  . doxycycline  100 mg Oral Q12H  . furosemide  20 mg Intravenous Q8H  . furosemide  40 mg Oral Q breakfast  . spironolactone  100 mg Oral Q breakfast   Continuous Infusions: . albumin human       LOS: 4 days    Time spent: 30 minutes    Vassie Loll, MD [facility] Pager  [phone]  If 7PM-7AM, please contact night-coverage [email or web address] Password [facility] 01/26/2018, 5:13 PM

## 2018-01-26 NOTE — Discharge Instructions (Signed)
EGD Discharge instructions Please read the instructions outlined below and refer to this sheet in the next few weeks. These discharge instructions provide you with general information on caring for yourself after you leave the hospital. Your doctor may also give you specific instructions. While your treatment has been planned according to the most current medical practices available, unavoidable complications occasionally occur. If you have any problems or questions after discharge, please call your doctor. ACTIVITY  You may resume your regular activity but move at a slower pace for the next 24 hours.   Take frequent rest periods for the next 24 hours.   Walking will help expel (get rid of) the air and reduce the bloated feeling in your abdomen.   No driving for 24 hours (because of the anesthesia (medicine) used during the test).   You may shower.   Do not sign any important legal documents or operate any machinery for 24 hours (because of the anesthesia used during the test).  NUTRITION  Drink plenty of fluids.   You may resume your normal diet.   Begin with a light meal and progress to your normal diet.   Avoid alcoholic beverages for 24 hours or as instructed by your caregiver.  MEDICATIONS  You may resume your normal medications unless your caregiver tells you otherwise.  WHAT YOU CAN EXPECT TODAY  You may experience abdominal discomfort such as a feeling of fullness or gas pains.  FOLLOW-UP  Your doctor will discuss the results of your test with you.  SEEK IMMEDIATE MEDICAL ATTENTION IF ANY OF THE FOLLOWING OCCUR:  Excessive nausea (feeling sick to your stomach) and/or vomiting.   Severe abdominal pain and distention (swelling).   Trouble swallowing.   Temperature over 101 F (37.8 C).   Rectal bleeding or vomiting of blood.    GERD information provided  Stop Protonix; begin Dexilant 60 mg daily-go by my office for 3 week supply of samples  You have  very  large tonsils that look irritated. This may be part of your problem. You may need to see an ENT doctor.  Office visit with us in 2 months.

## 2018-01-27 LAB — CULTURE, BLOOD (ROUTINE X 2)
Culture: NO GROWTH
Culture: NO GROWTH
SPECIAL REQUESTS: ADEQUATE
Special Requests: ADEQUATE

## 2018-01-27 LAB — COMPREHENSIVE METABOLIC PANEL
ALT: 53 U/L (ref 17–63)
ANION GAP: 10 (ref 5–15)
AST: 112 U/L — ABNORMAL HIGH (ref 15–41)
Albumin: 1.7 g/dL — ABNORMAL LOW (ref 3.5–5.0)
Alkaline Phosphatase: 347 U/L — ABNORMAL HIGH (ref 38–126)
BUN: 10 mg/dL (ref 6–20)
CALCIUM: 8.1 mg/dL — AB (ref 8.9–10.3)
CHLORIDE: 97 mmol/L — AB (ref 101–111)
CO2: 26 mmol/L (ref 22–32)
Creatinine, Ser: 0.5 mg/dL — ABNORMAL LOW (ref 0.61–1.24)
GFR calc non Af Amer: >60 mL/min (ref >60)
Glucose, Bld: 83 mg/dL (ref 65–99)
Potassium: 3.7 mmol/L (ref 3.5–5.1)
SODIUM: 133 mmol/L — AB (ref 135–145)
Total Bilirubin: 16.3 mg/dL — ABNORMAL HIGH (ref 0.3–1.2)
Total Protein: 7.3 g/dL (ref 6.5–8.1)

## 2018-01-27 LAB — CBC WITH DIFFERENTIAL/PLATELET
Basophils Absolute: 0.2 10*3/uL — ABNORMAL HIGH (ref 0.0–0.1)
Basophils Relative: 1 %
EOS ABS: 0.5 10*3/uL (ref 0.0–0.7)
Eosinophils Relative: 3 %
HCT: 29.9 % — ABNORMAL LOW (ref 39.0–52.0)
Hemoglobin: 10.4 g/dL — ABNORMAL LOW (ref 13.0–17.0)
Lymphocytes Relative: 9 %
Lymphs Abs: 1.5 10*3/uL (ref 0.7–4.0)
MCH: 30.7 pg (ref 26.0–34.0)
MCHC: 34.8 g/dL (ref 30.0–36.0)
MCV: 88.2 fL (ref 78.0–100.0)
MONO ABS: 1.9 10*3/uL — AB (ref 0.1–1.0)
Monocytes Relative: 11 %
NEUTROS ABS: 13 10*3/uL — AB (ref 1.7–7.7)
NEUTROS PCT: 76 %
PLATELETS: 420 10*3/uL — AB (ref 150–400)
RBC: 3.39 MIL/uL — ABNORMAL LOW (ref 4.22–5.81)
RDW: 17.1 % — ABNORMAL HIGH (ref 11.5–15.5)
WBC: 17.1 10*3/uL — ABNORMAL HIGH (ref 4.0–10.5)

## 2018-01-27 NOTE — Progress Notes (Addendum)
Patient states he feels less "swollen" today. Good BM overnight. Tolerating diet  Vital signs in last 24 hours: Temp:  [99 F (37.2 C)-99.8 F (37.7 C)] 99 F (37.2 C) (04/19 0640) Pulse Rate:  [84-118] 84 (04/19 0640) Resp:  [19-20] 20 (04/19 0640) BP: (107-122)/(59-90) 109/64 (04/19 0640) SpO2:  [96 %-100 %] 100 % (04/19 0640) Weight:  [181 lb 4.8 oz (82.2 kg)] 181 lb 4.8 oz (82.2 kg) (04/19 0639) Last BM Date: 01/26/18 General:   Alert,   pleasant and cooperative in NAD Abdomen:  Full.Positive bowel sounds soft and nontender. Intake/Output from previous day: 04/18 0701 - 04/19 0700 In: 1320 [P.O.:1320] Out: 7800 [Urine:7800] Intake/Output this shift: No intake/output data recorded.  Lab Results: Recent Labs    01/25/18 0535 01/26/18 0422 01/27/18 0420  WBC 17.7* 20.4* 17.1*  HGB 10.2* 10.3* 10.4*  HCT 30.1* 30.7* 29.9*  PLT 411* 457* 420*   BMET Recent Labs    01/25/18 0535 01/27/18 0420  NA 132* 133*  K 3.6 3.7  CL 100* 97*  CO2 24 26  GLUCOSE 89 83  BUN 7 10  CREATININE 0.50* 0.50*  CALCIUM 7.7* 8.1*   LFT Recent Labs    01/26/18 0422 01/27/18 0420  PROT 7.1 7.3  ALBUMIN 1.2* 1.7*  AST 144* 112*  ALT 62 53  ALKPHOS 382* 347*  BILITOT 14.3* 16.3*  BILIDIR 8.1*  --   IBILI 6.2*  --    PT/INR No results for input(s): LABPROT, INR in the last 72 hours. Hepatitis Panel No results for input(s): HEPBSAG, HCVAB, HEPAIGM, HEPBIGM in the last 72 hours. C-Diff No results for input(s): CDIFFTOX in the last 72 hours.  Studies/Results: Mr Abdomen Mrcp Wo Contrast  Result Date: 01/25/2018 CLINICAL DATA:  Decompensated cirrhosis. Abnormal liver function tests. Bilirubin equal 14 EXAM: MRI ABDOMEN WITHOUT CONTRAST  (INCLUDING MRCP) TECHNIQUE: Multiplanar multisequence MR imaging of the abdomen was performed. Heavily T2-weighted images of the biliary and pancreatic ducts were obtained, and three-dimensional MRCP images were rendered by post processing.  COMPARISON:  MRI abdomen 01/23/2018, CT 01/22/2018 FINDINGS: Exam is severely degraded by patient motion. Lower chest:  Lung bases are clear. Hepatobiliary: Morphologic changes in liver consistent cirrhosis again noted. Common hepatic duct and common bile duct are distended to a similar degree to comparison MRI. Difficult to measure but the common hepatic duct measures 12 mm (image 26/5) and the common bile duct measures approximately 8 mm. No filling defects within ducts noted. There is duct dilatation segmentally within the RIGHT hepatic lobe (segment 8). No additional intrahepatic duct dilatation. These findings are not changed. The gallbladder is elongated with mild gallbladder wall thickening related to ascites. Pancreas: Normal pancreatic parenchymal intensity. No ductal dilatation or inflammation. Spleen: Splenomegaly Adrenals/urinary tract: Adrenal glands and kidneys are normal. Stomach/Bowel: Stomach and limited of the small bowel is unremarkable Vascular/Lymphatic: Abdominal aortic normal caliber. No retroperitoneal periportal lymphadenopathy. Musculoskeletal: No aggressive osseous lesion IMPRESSION: 1. Exam is severely degraded by patient motion. 2. No significant change from MRI 2 days prior. 3. Mild to moderate extrahepatic duct dilatation. No significant intrahepatic duct dilatation. No obstructing lesion identified. 4. Moderate volume ascites. Electronically Signed   By: Genevive Bi M.D.   On: 01/25/2018 17:52   Mr 3d Recon At Scanner  Result Date: 01/25/2018 CLINICAL DATA:  Decompensated cirrhosis. Abnormal liver function tests. Bilirubin equal 14 EXAM: MRI ABDOMEN WITHOUT CONTRAST  (INCLUDING MRCP) TECHNIQUE: Multiplanar multisequence MR imaging of the abdomen was performed. Heavily T2-weighted images of  the biliary and pancreatic ducts were obtained, and three-dimensional MRCP images were rendered by post processing. COMPARISON:  MRI abdomen 01/23/2018, CT 01/22/2018 FINDINGS: Exam is  severely degraded by patient motion. Lower chest:  Lung bases are clear. Hepatobiliary: Morphologic changes in liver consistent cirrhosis again noted. Common hepatic duct and common bile duct are distended to a similar degree to comparison MRI. Difficult to measure but the common hepatic duct measures 12 mm (image 26/5) and the common bile duct measures approximately 8 mm. No filling defects within ducts noted. There is duct dilatation segmentally within the RIGHT hepatic lobe (segment 8). No additional intrahepatic duct dilatation. These findings are not changed. The gallbladder is elongated with mild gallbladder wall thickening related to ascites. Pancreas: Normal pancreatic parenchymal intensity. No ductal dilatation or inflammation. Spleen: Splenomegaly Adrenals/urinary tract: Adrenal glands and kidneys are normal. Stomach/Bowel: Stomach and limited of the small bowel is unremarkable Vascular/Lymphatic: Abdominal aortic normal caliber. No retroperitoneal periportal lymphadenopathy. Musculoskeletal: No aggressive osseous lesion IMPRESSION: 1. Exam is severely degraded by patient motion. 2. No significant change from MRI 2 days prior. 3. Mild to moderate extrahepatic duct dilatation. No significant intrahepatic duct dilatation. No obstructing lesion identified. 4. Moderate volume ascites. Electronically Signed   By: Genevive BiStewart  Edmunds M.D.   On: 01/25/2018 17:52    Impression:  Pleasant 42 year old gentleman with newly diagnosed decompensated advanced liver disease in the setting of acute left lower extremity cellulitis.  Multiple autoimmune markers positive including AMA.  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 Histology needed to help sort out further  Patient looks much better to me today.  Appears to be undergoing a nice diuresis with albumin and additional Lasix6 doses (weight documented at 181 today; 203 yesterday).  6.5 L negative fluid balance  over the past 24 hours. Renal function remains normal.  MELD - Na 25  Recommendations:   Continue 2 g sodium diet. Complete a total of 6 doses out albumin followed by additional Lasix.  Follow BMET. Plan for liver biopsy the first of the week. I put a call into Dr. Wilford GristSkip Hayashi -[facility] [phone]) to get his recommendations on further management and to arrange an outpatient transplant evaluation. Await call back.  Dr. Karilyn Cotaehman will see as needed over the weekend.

## 2018-01-27 NOTE — Progress Notes (Signed)
Patient ID: Reginald Richardson, male   DOB: 09/07/1976, 42 y.o.   MRN: [id number]   [facility] for Random liver biopsy  Orders in place  [facility] Mon [facility]22 [facility]

## 2018-01-27 NOTE — Progress Notes (Signed)
PROGRESS NOTE    Reginald BushyShaun Richardson  MVH:[id number] DOB: 08/12/1976 DOA: 01/22/2018 PCP: Patient, No Pcp Per   Brief Narrative:  Reginald BushyShaun Spath is a 42 y.o. male with no previous medical history who is coming to the emergency department due to lower extremity swelling for 2 weeks and 2 days of left foot pain/worsening swelling. He has had chronic jaundice and skin lesions for a lot of months or almost a year, according to his mother.  He has frequent pruritus and has multiple skin abrasions from scratching.  He recently started having occasional bleeding gums.  He has been admitted with bilateral lower extremity cellulitis and has been started on vancomycin and Zosyn.  He also appears to have findings of obstructive jaundice for which workup is currently pending with consultation to GI.   Assessment & Plan:   1. Left lower extremity cellulitis:  No fever, WBC's has trended down to 17,000; patient continued to demonstrate an improvement in the pain, erythema and swelling of his left lower extremity.Will continue PO antibiotics, leg elevation and treatment for 3rd spacing as specified below.  Continue to follow clinical response.   2. Obstructive jaundice with cirrhosis.  LFTs has remained stable/slightly decreased. appreciate assistance from gastroenterology service and will follow further recommendations. 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  GI service has [facility] and the plan is to continue current treatment for stabilization, no biopsy to be done during this admission.  Expedite outpatient follow-[facility] to be arranged by GI.  Patient most likely home in the next 1-2 days.  Continue Lasix and spironolactone. 3. 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  INR was initially elevated with a subsequent return to normal after vitamin K was given.  There is no signs of  acute bleeding.  Continue to follow clinical response.  4. Anemia.  Overall stable.  Continue intermittent CBC to follow hemoglobin trend.  No signs of acute bleeding appreciated. 5. Hyponatremia/hypoalbuminemia.  Likely secondary to diarrhea, overall decreased oral intake and dilutional effect.  Plan is to provide albumin infusion with follow-up with IV Lasix.  Will also continue spironolactone.  Follow further GI recommendations.  6. Severe protein calorie malnutrition: Continue to follow nutritional service recommendation for feeding supplements.      DVT prophylaxis:SCDs Code Status: Full Family Communication: Mother and Aunt at bedside. Disposition Plan: Follow GI service recommendation, transition antibiotics to p.o., follow electrolytes and LFTs trend.  Patient clinically improved.   Consultants:   GI  Procedures:   Bilateral Doppler ultrasound of lower extremities without DVT.  Antimicrobials:   Vancomycin and Zosyn 4/14->4/15  Vancomycin and Ancef 4/15->4/17  Doxycycline and keflex 4/17   Subjective: Patient without any fever, no chest pain or shortness of breath.  Reports having good bowel movement earlier this morning and express feeling less swollen.  Improvement in the redness and swelling on his left lower extremity also reported.  Objective: Vitals:   01/26/18 2039 01/26/18 2259 01/27/18 0639 01/27/18 0640  BP:  (!) 107/59  109/64  Pulse:  (!) 105  84  Resp:  20  20  Temp:  99.3 F (37.4 C)  99 F (37.2 C)  TempSrc:  Oral  Oral  SpO2: 97% 96%  100%  Weight:   82.2 kg (181 lb 4.8 oz)   Height:        Intake/Output Summary (Last 24 hours) at 01/27/2018 1413  Last data filed at 01/27/2018 1200 Gross per 24 hour  Intake 720 ml  Output 7050 ml  Net -6330 ml   Filed Weights   01/26/18 0420 01/26/18 0827 01/27/18 0639  Weight: 92.2 kg (203 lb 4.2 oz) 86.9 kg (191 lb 8 oz) 82.2 kg (181 lb 4.8 oz)    Examination:  General exam: Afebrile, no acute  distress, no chest pain, no shortness of breath.  Patient reports feeling less swollen overall and is reporting improvement in the pain and erythema on his left lower extremity. Respiratory system: Good air movement bilaterally, no wheezing, no crackles, no using accessory muscles. Cardiovascular system: S1 and S2, no rubs, no gallops, no JVD.  Still with 2+ edema bilaterally appreciated exam. Gastrointestinal system: Fairly mild distention on exam, no tenderness on palpation, soft, no guarding, positive bowel sounds. Central nervous system: Cranial nerves intact, alert, awake and oriented x3, no focal deficits, following commands properly. Extremities: 2+ edema appreciated bilaterally (left more than right), continue to have improvement in the erythema and discomfort on his left lower extremity.  No cyanosis, no open wounds or drainage, no clubbing. Skin: Patient continued to have improvement in the erythema and swelling associated with the left lower extremity cellulitic process.  No open wounds, no induration, no rash or petechiae.   Psychiatry: Good judgment, preserved insight, no hallucinations, stable mood.  Data Reviewed: I have personally reviewed following labs and imaging studies  CBC: Recent Labs  Lab 01/23/18 0547 01/24/18 0533 01/25/18 0535 01/26/18 0422 01/27/18 0420  WBC 16.8* 13.6* 17.7* 20.4* 17.1*  NEUTROABS 13.1* 10.3* 14.0* 16.2* 13.0*  HGB 10.0* 10.1* 10.2* 10.3* 10.4*  HCT 28.8* 29.4* 30.1* 30.7* 29.9*  MCV 86.2 87.2 87.2 88.5 88.2  PLT 368 372 411* 457* 420*   Basic Metabolic Panel: Recent Labs  Lab 01/22/18 2031 01/23/18 0547 01/24/18 0533 01/25/18 0535 01/27/18 0420  NA 132* 130* 132* 132* 133*  K 4.0 3.7 3.8 3.6 3.7  CL 98* 98* 101 100* 97*  CO2 24 23 23 24 26   GLUCOSE 146* 89 79 89 83  BUN 10 9 8 7 10   CREATININE 0.64 0.59* 0.46* 0.50* 0.50*  CALCIUM 8.0* 7.6* 7.6* 7.7* 8.1*  MG 1.9  --   --   --   --   PHOS 3.4  --   --   --   --     GFR: Estimated Creatinine Clearance: 136.1 mL/min (A) (by C-G formula based on SCr of 0.5 mg/dL (L)).   Liver Function Tests: Recent Labs  Lab 01/23/18 0547 01/24/18 0533 01/25/18 0535 01/26/18 0422 01/27/18 0420  AST 140* 141* 142* 144* 112*  ALT 66* 64* 66* 62 53  ALKPHOS 347* 341* 374* 382* 347*  BILITOT 13.6* 14.2* 14.9* 14.3* 16.3*  PROT 6.7 6.7 7.2 7.1 7.3  ALBUMIN 1.2* 1.2* 1.3* 1.2* 1.7*   Coagulation Profile: Recent Labs  Lab 01/22/18 2031 01/23/18 1151 01/24/18 0533  INR 7.38* 1.53 1.45   Sepsis Labs: Recent Labs  Lab 01/22/18 2031  LATICACIDVEN 1.8    Recent Results (from the past 240 hour(s))  Blood culture (routine x 2)     Status: None   Collection Time: 01/22/18  8:31 PM  Result Value Ref Range Status   Specimen Description BLOOD RIGHT FOREARM  Final   Special Requests   Final    BOTTLES DRAWN AEROBIC AND ANAEROBIC Blood Culture adequate volume   Culture   Final    NO GROWTH 5 DAYS Performed  at [facility], [address] [place], [place] [address]    Report Status 01/27/2018 FINAL  Final  Blood culture (routine x 2)     Status: None   Collection Time: 01/22/18  8:43 PM  Result Value Ref Range Status   Specimen Description BLOOD LEFT FOREARM  Final   Special Requests   Final    BOTTLES DRAWN AEROBIC ONLY Blood Culture adequate volume   Culture   Final    NO GROWTH 5 DAYS Performed at [facility], [address] [place], [place] [address]    Report Status 01/27/2018 FINAL  Final  MRSA PCR Screening     Status: None   Collection Time: 01/24/18  7:06 AM  Result Value Ref Range Status   MRSA by PCR NEGATIVE NEGATIVE Final    Comment:        The GeneXpert MRSA Assay (FDA approved for NASAL specimens only), is one component of a comprehensive MRSA colonization surveillance program. It is not intended to diagnose MRSA infection nor to guide or monitor treatment for MRSA infections. Performed at [facility], [address],  [place], [place] [address]      Radiology Studies: Mr Abdomen Mrcp Wo Contrast  Result Date: 01/25/2018 CLINICAL DATA:  Decompensated cirrhosis. Abnormal liver function tests. Bilirubin equal 14 EXAM: MRI ABDOMEN WITHOUT CONTRAST  (INCLUDING MRCP) TECHNIQUE: Multiplanar multisequence MR imaging of the abdomen was performed. Heavily T2-weighted images of the biliary and pancreatic ducts were obtained, and three-dimensional MRCP images were rendered by post processing. COMPARISON:  MRI abdomen 01/23/2018, CT 01/22/2018 FINDINGS: Exam is severely degraded by patient motion. Lower chest:  Lung bases are clear. Hepatobiliary: Morphologic changes in liver consistent cirrhosis again noted. Common hepatic duct and common bile duct are distended to a similar degree to comparison MRI. Difficult to measure but the common hepatic duct measures 12 mm (image 26/5) and the common bile duct measures approximately 8 mm. No filling defects within ducts noted. There is duct dilatation segmentally within the RIGHT hepatic lobe (segment 8). No additional intrahepatic duct dilatation. These findings are not changed. The gallbladder is elongated with mild gallbladder wall thickening related to ascites. Pancreas: Normal pancreatic parenchymal intensity. No ductal dilatation or inflammation. Spleen: Splenomegaly Adrenals/urinary tract: Adrenal glands and kidneys are normal. Stomach/Bowel: Stomach and limited of the small bowel is unremarkable Vascular/Lymphatic: Abdominal aortic normal caliber. No retroperitoneal periportal lymphadenopathy. Musculoskeletal: No aggressive osseous lesion IMPRESSION: 1. Exam is severely degraded by patient motion. 2. No significant change from MRI 2 days prior. 3. Mild to moderate extrahepatic duct dilatation. No significant intrahepatic duct dilatation. No obstructing lesion identified. 4. Moderate volume ascites. Electronically Signed   By: Genevive Bi M.D.   On: 01/25/2018 17:52   Mr 3d Recon At  Scanner  Result Date: 01/25/2018 CLINICAL DATA:  Decompensated cirrhosis. Abnormal liver function tests. Bilirubin equal 14 EXAM: MRI ABDOMEN WITHOUT CONTRAST  (INCLUDING MRCP) TECHNIQUE: Multiplanar multisequence MR imaging of the abdomen was performed. Heavily T2-weighted images of the biliary and pancreatic ducts were obtained, and three-dimensional MRCP images were rendered by post processing. COMPARISON:  MRI abdomen 01/23/2018, CT 01/22/2018 FINDINGS: Exam is severely degraded by patient motion. Lower chest:  Lung bases are clear. Hepatobiliary: Morphologic changes in liver consistent cirrhosis again noted. Common hepatic duct and common bile duct are distended to a similar degree to comparison MRI. Difficult to measure but the common hepatic duct measures 12 mm (image 26/5) and the common bile duct measures approximately 8 mm. No  filling defects within ducts noted. There is duct dilatation segmentally within the RIGHT hepatic lobe (segment 8). No additional intrahepatic duct dilatation. These findings are not changed. The gallbladder is elongated with mild gallbladder wall thickening related to ascites. Pancreas: Normal pancreatic parenchymal intensity. No ductal dilatation or inflammation. Spleen: Splenomegaly Adrenals/urinary tract: Adrenal glands and kidneys are normal. Stomach/Bowel: Stomach and limited of the small bowel is unremarkable Vascular/Lymphatic: Abdominal aortic normal caliber. No retroperitoneal periportal lymphadenopathy. Musculoskeletal: No aggressive osseous lesion IMPRESSION: 1. Exam is severely degraded by patient motion. 2. No significant change from MRI 2 days prior. 3. Mild to moderate extrahepatic duct dilatation. No significant intrahepatic duct dilatation. No obstructing lesion identified. 4. Moderate volume ascites. Electronically Signed   By: Genevive Bi M.D.   On: 01/25/2018 17:52    Scheduled Meds: . cephALEXin  500 mg Oral Q8H  . doxycycline  100 mg Oral Q12H  .  furosemide  20 mg Intravenous Q8H  . furosemide  40 mg Oral Q breakfast  . spironolactone  100 mg Oral Q breakfast   Continuous Infusions: . albumin human       LOS: 5 days    Time spent: 30 minutes    Vassie Loll, MD [facility] Pager [phone]  If 7PM-7AM, please contact night-coverage [email or web address] Password [email or web address] 01/27/2018, 2:13 PM

## 2018-01-27 NOTE — Progress Notes (Signed)
Discussed with Dr. Julieta GuttingHayashi.  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 In setting of acute illness, he recommends holding off liver bx as findings may be confounding - as long as clinically stable / improving.  So, will hold off on Bx and plan for expedited transplant evaluation as out pt.

## 2018-01-28 DIAGNOSIS — K7460 Unspecified cirrhosis of liver: Secondary | ICD-10-CM

## 2018-01-28 DIAGNOSIS — E8770 Fluid overload, unspecified: Secondary | ICD-10-CM

## 2018-01-28 DIAGNOSIS — L03116 Cellulitis of left lower limb: Secondary | ICD-10-CM

## 2018-01-28 DIAGNOSIS — K8301 Primary sclerosing cholangitis: Secondary | ICD-10-CM

## 2018-01-28 DIAGNOSIS — R188 Other ascites: Secondary | ICD-10-CM

## 2018-01-28 DIAGNOSIS — D649 Anemia, unspecified: Secondary | ICD-10-CM

## 2018-01-28 LAB — COMPREHENSIVE METABOLIC PANEL
ALBUMIN: 2.4 g/dL — AB (ref 3.5–5.0)
ALT: 47 U/L (ref 17–63)
AST: 98 U/L — AB (ref 15–41)
Alkaline Phosphatase: 303 U/L — ABNORMAL HIGH (ref 38–126)
Anion gap: 10 (ref 5–15)
BILIRUBIN TOTAL: 17.3 mg/dL — AB (ref 0.3–1.2)
BUN: 10 mg/dL (ref 6–20)
CO2: 25 mmol/L (ref 22–32)
Calcium: 8.4 mg/dL — ABNORMAL LOW (ref 8.9–10.3)
Chloride: 98 mmol/L — ABNORMAL LOW (ref 101–111)
Creatinine, Ser: 0.49 mg/dL — ABNORMAL LOW (ref 0.61–1.24)
GFR calc Af Amer: >60 mL/min (ref >60)
GFR calc non Af Amer: >60 mL/min (ref >60)
GLUCOSE: 84 mg/dL (ref 65–99)
POTASSIUM: 3.8 mmol/L (ref 3.5–5.1)
Sodium: 133 mmol/L — ABNORMAL LOW (ref 135–145)
TOTAL PROTEIN: 7.6 g/dL (ref 6.5–8.1)

## 2018-01-28 LAB — CBC WITH DIFFERENTIAL/PLATELET
Basophils Absolute: 0.1 10*3/uL (ref 0.0–0.1)
Basophils Relative: 1 %
Eosinophils Absolute: 0.5 10*3/uL (ref 0.0–0.7)
Eosinophils Relative: 4 %
HEMATOCRIT: 28.9 % — AB (ref 39.0–52.0)
Hemoglobin: 10 g/dL — ABNORMAL LOW (ref 13.0–17.0)
LYMPHS PCT: 11 %
Lymphs Abs: 1.5 10*3/uL (ref 0.7–4.0)
MCH: 30.6 pg (ref 26.0–34.0)
MCHC: 34.6 g/dL (ref 30.0–36.0)
MCV: 88.4 fL (ref 78.0–100.0)
MONO ABS: 1.2 10*3/uL — AB (ref 0.1–1.0)
MONOS PCT: 9 %
NEUTROS PCT: 75 %
Neutro Abs: 10.4 10*3/uL — ABNORMAL HIGH (ref 1.7–7.7)
Platelets: 406 10*3/uL — ABNORMAL HIGH (ref 150–400)
RBC: 3.27 MIL/uL — AB (ref 4.22–5.81)
RDW: 17.1 % — AB (ref 11.5–15.5)
WBC: 13.7 10*3/uL — AB (ref 4.0–10.5)

## 2018-01-28 NOTE — Progress Notes (Signed)
PROGRESS NOTE    Reginald BushyShaun Kawahara  WUJ:[id number] DOB: 11/17/1975 DOA: 01/22/2018 PCP: Patient, No Pcp Per   Brief Narrative:  Reginald Richardson is a 42 y.o. male with no previous medical history who is coming to the emergency department due to lower extremity swelling for 2 weeks and 2 days of left foot pain/worsening swelling. He has had chronic jaundice and skin lesions for a lot of months or almost a year, according to his mother.  He has frequent pruritus and has multiple skin abrasions from scratching.  He recently started having occasional bleeding gums.  He has been admitted with bilateral lower extremity cellulitis and has been started on vancomycin and Zosyn.  He also appears to have findings of obstructive jaundice for which workup is currently pending with consultation to GI.   Assessment & Plan:   1. Left lower extremity cellulitis:  No fever, WBC's has trended down to 13,000; patient continued to demonstrate an improvement in the pain, erythema and swelling of his left lower extremity.Will continue PO antibiotics, leg elevation and treatment for 3rd spacing as specified below.  Continue to follow clinical response.   2. Obstructive jaundice with cirrhosis.  LFTs has remained stable/slightly decreased. appreciate assistance from gastroenterology service and will follow further recommendations. 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  GI service has [facility] and the plan is to continue current treatment for stabilization, no biopsy to be done during this admission.  Exped[facility] to be [facility] by GI.  Patient most likely home in the next 24-48 hours.  Continue Lasix and spironolactone as per GI recommendations. 3. 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  INR was initially elevated with a subsequent return to normal after vitamin K was  given.  There is no signs of acute bleeding.  Continue to follow clinical response.  4. Anemia.  Overall stable.  Continue intermittent CBC to follow hemoglobin trend.  No signs of acute bleeding appreciated. 5. Hyponatremia/hypoalbuminemia.  Likely secondary to diarrhea, overall decreased oral intake and dilutional effect.  Patient has completed albumin infusion; continue lasix and spironolactone as dictated by GI.  Follow further GI recommendations.  6. Severe protein calorie malnutrition: Continue to follow nutritional service recommendation for feeding supplements.      DVT prophylaxis:SCDs Code Status: Full Family Communication: Mother and Aunt at bedside. Disposition Plan: Follow GI service recommendation, transition antibiotics to p.o., follow electrolytes and LFTs trend.  Patient clinically improved.   Consultants:   GI  Procedures:   Bilateral Doppler ultrasound of lower extremities without DVT.  Antimicrobials:   Vancomycin and Zosyn 4/14->4/15  Vancomycin and Ancef 4/15->4/17  Doxycycline and keflex 4/17   Subjective: Patient without any fever, no chest pain, no shortness of breath.  Continue to have really good response to diuresis, decrease pain/swelling/erythema on his left lower extremity and is overall feeling better.  He reports having a good bowel movement overnight.  Objective: Vitals:   01/27/18 2034 01/28/18 0500 01/28/18 0510 01/28/18 1328  BP:   (!) 109/44 103/77  Pulse:   94 (!) 102  Resp:    18  Temp:   97.8 F (36.6 C) 98.6 F (37 C)  TempSrc:   Oral Oral  SpO2: 96%  100% 100%  Weight:  80.3 kg (177 lb 0.5 oz)    Height:        Intake/Output Summary (Last 24 hours)  at 01/28/2018 1536 Last data filed at 01/28/2018 1300 Gross per 24 hour  Intake 600 ml  Output 2400 ml  Net -1800 ml   Filed Weights   01/26/18 0827 01/27/18 0639 01/28/18 0500  Weight: 86.9 kg (191 lb 8 oz) 82.2 kg (181 lb 4.8 oz) 80.3 kg (177 lb 0.5 oz)     Examination:  General exam: Continue to have icterus, no further fevers, no acute distress, patient denies chest pain or shortness of breath.  Swelling continued to improve appropriately, patient also reported less pain and decreased erythema in his left lower extremity.  Respiratory system: Good air movement bilaterally, no wheezing, no crackles, no using accessory muscles.  Good oxygen saturation on room air.   Cardiovascular system: S1 and S2, no rubs, no gallops, no JVD, no murmurs. Gastrointestinal system: Mild distention appreciated on exam, no tenderness, no guarding, positive bowel sounds.  No palpable masses.. Central nervous system: Cranial nerves grossly intact, no focal deficits, following commands properly.. Extremities: 1-2+ edema appreciated bilaterally, no open wounds, no drainage, significant improvement in the erythema toes changes on his left lower extremity due to cellulitic process. Skin: Erythema process continue to improve, less swelling appreciated.  There is no open wounds, drainage, induration, rash or any other complaints.  .   Psychiatry: Good judgment and insight, no hallucinations, no suicidal ideation, mood is stable.    Data Reviewed: I have personally reviewed following labs and imaging studies  CBC: Recent Labs  Lab 01/24/18 0533 01/25/18 0535 01/26/18 0422 01/27/18 0420 01/28/18 0650  WBC 13.6* 17.7* 20.4* 17.1* 13.7*  NEUTROABS 10.3* 14.0* 16.2* 13.0* 10.4*  HGB 10.1* 10.2* 10.3* 10.4* 10.0*  HCT 29.4* 30.1* 30.7* 29.9* 28.9*  MCV 87.2 87.2 88.5 88.2 88.4  PLT 372 411* 457* 420* 406*   Basic Metabolic Panel: Recent Labs  Lab 01/22/18 2031 01/23/18 0547 01/24/18 0533 01/25/18 0535 01/27/18 0420 01/28/18 0650  NA 132* 130* 132* 132* 133* 133*  K 4.0 3.7 3.8 3.6 3.7 3.8  CL 98* 98* 101 100* 97* 98*  CO2 24 23 23 24 26 25   GLUCOSE 146* 89 79 89 83 84  BUN 10 9 8 7 10 10   CREATININE 0.64 0.59* 0.46* 0.50* 0.50* 0.49*  CALCIUM 8.0* 7.6*  7.6* 7.7* 8.1* 8.4*  MG 1.9  --   --   --   --   --   PHOS 3.4  --   --   --   --   --    GFR: Estimated Creatinine Clearance: 136.1 mL/min (A) (by C-G formula based on SCr of 0.49 mg/dL (L)).   Liver Function Tests: Recent Labs  Lab 01/24/18 0533 01/25/18 0535 01/26/18 0422 01/27/18 0420 01/28/18 0650  AST 141* 142* 144* 112* 98*  ALT 64* 66* 62 53 47  ALKPHOS 341* 374* 382* 347* 303*  BILITOT 14.2* 14.9* 14.3* 16.3* 17.3*  PROT 6.7 7.2 7.1 7.3 7.6  ALBUMIN 1.2* 1.3* 1.2* 1.7* 2.4*   Coagulation Profile: Recent Labs  Lab 01/22/18 2031 01/23/18 1151 01/24/18 0533  INR 7.38* 1.53 1.45   Sepsis Labs: Recent Labs  Lab 01/22/18 2031  LATICACIDVEN 1.8    Recent Results (from the past 240 hour(s))  Blood culture (routine x 2)     Status: None   Collection Time: 01/22/18  8:31 PM  Result Value Ref Range Status   Specimen Description BLOOD RIGHT FOREARM  Final   Special Requests   Final    BOTTLES DRAWN AEROBIC AND  ANAEROBIC Blood Culture adequate volume   Culture   Final    NO GROWTH 5 DAYS Performed at [facility], [address] [place], [place] [address]    Report Status 01/27/2018 FINAL  Final  Blood culture (routine x 2)     Status: None   Collection Time: 01/22/18  8:43 PM  Result Value Ref Range Status   Specimen Description BLOOD LEFT FOREARM  Final   Special Requests   Final    BOTTLES DRAWN AEROBIC ONLY Blood Culture adequate volume   Culture   Final    NO GROWTH 5 DAYS Performed at [facility], [address], [place], [place] [address]    Report Status 01/27/2018 FINAL  Final  MRSA PCR Screening     Status: None   Collection Time: 01/24/18  7:06 AM  Result Value Ref Range Status   MRSA by PCR NEGATIVE NEGATIVE Final    Comment:        The GeneXpert MRSA Assay (FDA approved for NASAL specimens only), is one component of a comprehensive MRSA colonization surveillance program. It is not intended to diagnose MRSA infection nor to guide  or monitor treatment for MRSA infections. Performed at [facility], [address] [place], [place] [address]      Radiology Studies: No results found.  Scheduled Meds: . cephALEXin  500 mg Oral Q8H  . doxycycline  100 mg Oral Q12H  . furosemide  40 mg Oral Q breakfast  . spironolactone  100 mg Oral Q breakfast   Continuous Infusions:    LOS: 6 days    Time spent: 30 minutes    Vassie Loll, MD [facility] Pager [phone]  If 7PM-7AM, please contact night-coverage [email or web address] Password [facility] 01/28/2018, 3:36 PM

## 2018-01-28 NOTE — Progress Notes (Signed)
  Subjective:  Patient feels better.  He denies nausea vomiting or abdominal pain.  He says left leg swelling has decreased significantly. 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  He also mentions that his mother has been telling him that his eyes are yellow for the several months may be a year.  Family history is negative for liver disease.     Objective: Blood pressure (!) 109/44, pulse 94, temperature 97.8 F (36.6 C), temperature source Oral, resp. rate 16, height 6' (1.829 m), weight 177 lb 0.5 oz (80.3 kg), SpO2 100 %. Patient is alert and he does not have asterixis. Conjunctiva is pink. Sclera is icteric Oropharyngeal mucosa is normal. No neck masses or thyromegaly noted. Cardiac exam with regular rhythm normal S1 and S2. No murmur or gallop noted. Lungs are clear to auscultation. Abdomen is full but soft and nontender with organomegaly or masses. Trace edema noted to the right foot and leg. Plus pitting edema noted to right leg and foot.  There is fine wrinkling to skin suggesting decrease in edema.   Urine output 3000 mL in  last 24 hours.  Labs/studies Results:  Recent Labs    01/26/18 0422 01/27/18 0420 01/28/18 0650  WBC 20.4* 17.1* 13.7*  HGB 10.3* 10.4* 10.0*  HCT 30.7* 29.9* 28.9*  PLT 457* 420* 406*    BMET  Recent Labs    01/27/18 0420 01/28/18 0650  NA 133* 133*  K 3.7 3.8  CL 97* 98*  CO2 26 25  GLUCOSE 83 84  BUN 10 10  CREATININE 0.50* 0.49*  CALCIUM 8.1* 8.4*    LFT  Recent Labs    01/26/18 0422 01/27/18 0420 01/28/18 0650  PROT 7.1 7.3 7.6  ALBUMIN 1.2* 1.7* 2.4*  AST 144* 112* 98*  ALT 62 53 47  ALKPHOS 382* 347* 303*  BILITOT 14.3* 16.3* 17.3*  BILIDIR 8.1*  --   --   IBILI 6.2*  --   --     CT and MRCP images reviewed.  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   No history of IBD.  Patient does not take any medications.  Interesting to note that platelet count is normal.   We will also screen for sarcoidosis.  #2.  Fluid overload.  He has responded to IV albumin and diuresis. He has completed albumin infusion as ordered by Dr. [name].  #3.  Left leg cellulitis.  He is responding to therapy.  WBC is down to 13.7.  #4.  Anemia secondary to chronic illness.   Recommendations:  Will check ACE level in a.m..Marland Kitchen

## 2018-01-29 LAB — COMPREHENSIVE METABOLIC PANEL
ALBUMIN: 2.4 g/dL — AB (ref 3.5–5.0)
ALT: 56 U/L (ref 17–63)
ANION GAP: 14 (ref 5–15)
AST: 121 U/L — ABNORMAL HIGH (ref 15–41)
Alkaline Phosphatase: 357 U/L — ABNORMAL HIGH (ref 38–126)
BILIRUBIN TOTAL: 19.5 mg/dL — AB (ref 0.3–1.2)
BUN: 11 mg/dL (ref 6–20)
CHLORIDE: 98 mmol/L — AB (ref 101–111)
CO2: 23 mmol/L (ref 22–32)
Calcium: 9 mg/dL (ref 8.9–10.3)
Creatinine, Ser: 0.36 mg/dL — ABNORMAL LOW (ref 0.61–1.24)
GFR calc Af Amer: >60 mL/min (ref >60)
GFR calc non Af Amer: >60 mL/min (ref >60)
GLUCOSE: 87 mg/dL (ref 65–99)
POTASSIUM: 4 mmol/L (ref 3.5–5.1)
SODIUM: 135 mmol/L (ref 135–145)
TOTAL PROTEIN: 8.4 g/dL — AB (ref 6.5–8.1)

## 2018-01-29 LAB — CBC WITH DIFFERENTIAL/PLATELET
BASOS ABS: 0.1 10*3/uL (ref 0.0–0.1)
Basophils Relative: 1 %
EOS ABS: 0.4 10*3/uL (ref 0.0–0.7)
Eosinophils Relative: 3 %
HEMATOCRIT: 30.6 % — AB (ref 39.0–52.0)
Hemoglobin: 10.8 g/dL — ABNORMAL LOW (ref 13.0–17.0)
LYMPHS ABS: 1.4 10*3/uL (ref 0.7–4.0)
Lymphocytes Relative: 11 %
MCH: 30.4 pg (ref 26.0–34.0)
MCHC: 35.3 g/dL (ref 30.0–36.0)
MCV: 86.2 fL (ref 78.0–100.0)
MONO ABS: 1.2 10*3/uL — AB (ref 0.1–1.0)
MONOS PCT: 9 %
Neutro Abs: 9.7 10*3/uL — ABNORMAL HIGH (ref 1.7–7.7)
Neutrophils Relative %: 76 %
PLATELETS: 456 10*3/uL — AB (ref 150–400)
RBC: 3.55 MIL/uL — ABNORMAL LOW (ref 4.22–5.81)
RDW: 17.2 % — AB (ref 11.5–15.5)
WBC: 12.8 10*3/uL — AB (ref 4.0–10.5)

## 2018-01-29 NOTE — Progress Notes (Signed)
PROGRESS NOTE    Grace BushyShaun Rumery  IRJ:[id number] DOB: 02/18/1976 DOA: 01/22/2018 PCP: Patient, No Pcp Per   Brief Narrative:  Grace BushyShaun Tellado is a 42 y.o. male with no previous medical history who is coming to the emergency department due to lower extremity swelling for 2 weeks and 2 days of left foot pain/worsening swelling. He has had chronic jaundice and skin lesions for a lot of months or almost a year, according to his mother.  He has frequent pruritus and has multiple skin abrasions from scratching.  He recently started having occasional bleeding gums.  He has been admitted with bilateral lower extremity cellulitis and has been started on vancomycin and Zosyn.  He also appears to have findings of obstructive jaundice for which workup is currently pending with consultation to GI.   Assessment & Plan:   1. Left lower extremity cellulitis:  No fever, WBC's has trended down to 12,000; patient continued to demonstrate an improvement in the pain, erythema and swelling of his left lower extremity. Will continue PO antibiotics (4-5 days of treatment pending), leg elevation and treatment for 3rd spacing as specified below.  Continue to follow clinical response.   2. Obstructive jaundice with cirrhosis.  LFTs has remained stable/slightly decreased. appreciate assistance from gastroenterology service and will follow further recommendations. 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  GI service has [facility] and the plan is to continue current treatment for stabilization, no biopsy to be done during this admission.  Expedite outpatient [facility] to be [facility] by GI.  Patient most likely home in the next 24-48 hours.  Continue Lasix and spironolactone as per GI recommendations. High concerns with rising bilirubin. 3. 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  INR was  initially elevated with a subsequent return to normal after vitamin K was given.  There is no signs of acute bleeding.  Continue to follow clinical response.  4. Anemia.  Overall stable.  Continue intermittent CBC to follow hemoglobin trend.  No signs of acute bleeding appreciated. 5. Hyponatremia/hypoalbuminemia.  Likely secondary to diarrhea, overall decreased oral intake and dilutional effect.  Patient has completed albumin infusion; continue lasix and spironolactone as dictated by GI.  Follow further GI recommendations.  6. Severe protein calorie malnutrition: Continue to follow nutritional service recommendation for feeding supplements.      DVT prophylaxis:SCDs Code Status: Full Family Communication: Mother and Aunt at bedside. Disposition Plan: Follow GI service recommendation, transition antibiotics to p.o., follow electrolytes and LFTs trend.  Patient clinically improved.   Consultants:   GI  Procedures:   Bilateral Doppler ultrasound of lower extremities without DVT.  Antimicrobials:   Vancomycin and Zosyn 4/14->4/15  Vancomycin and Ancef 4/15->4/17  Doxycycline and keflex 4/17   Subjective: Patient has remained afebrile, no chest pain, no shortness of breath.  He reports feeling fullness sensation in his abdomen, but denies pain. Currently expressed significant improvement in his overall swelling.   Objective: Vitals:   01/28/18 1328 01/28/18 2131 01/29/18 0453 01/29/18 1339  BP: 103/77 119/69 113/78 96/63  Pulse: (!) 102 85 85 (!) 57  Resp: 18   16  Temp: 98.6 F (37 C) 97.6 F (36.4 C) 97.7 F (36.5 C) 98.1 F (36.7 C)  TempSrc: Oral Oral Oral Oral  SpO2: 100% 100% 100% 100%  Weight:   79.3 kg (174 lb 13.2 oz)   Height:  Intake/Output Summary (Last 24 hours) at 01/29/2018 1606 Last data filed at 01/28/2018 1922 Gross per 24 hour  Intake 480 ml  Output 650 ml  Net -170 ml   Filed Weights   01/27/18 0639 01/28/18 0500 01/29/18 0453  Weight:  82.2 kg (181 lb 4.8 oz) 80.3 kg (177 lb 0.5 oz) 79.3 kg (174 lb 13.2 oz)    Examination: General exam: No fever, no chest pain, no shortness of breath.  Overall swelling, erythema and pain in his left lower extremity continued to improve.  He continued to have jaundice and icterus.  Bilirubin level rising. Respiratory system: Good air movement bilaterally, no wheezing, no crackles, no using accessory muscles.   Cardiovascular system: S1 and S2, no rubs, no gallops, no JVD.   Gastrointestinal system: Mild distention appreciated on exam, no tenderness, no guarding, positive bowel sounds, no palpable masses on exam.  Central nervous system: Cranial nerves grossly intact, patient is alert, awake and oriented x3, no focal deficits, able to follow commands appropriately.   Extremities: 1+ edema appreciated on his lower extremities bilaterally (left more than right), no open wounds, no drainage, significant improvement in previous appreciated erythematous changes from cellulitic process.  Patient reports just mild pain with deep palpation and bearing weight.  Skin: Significant improvement on his erythema process appreciated left lower extremity.  There is no other open wounds, induration, petechiae, or rash.  As mentioned above he has jaundice. Psychiatry: Good judgment and preserved insight, no suicidal ideation, stable mood.  Data Reviewed: I have personally reviewed following labs and imaging studies  CBC: Recent Labs  Lab 01/25/18 0535 01/26/18 0422 01/27/18 0420 01/28/18 0650 01/29/18 0615  WBC 17.7* 20.4* 17.1* 13.7* 12.8*  NEUTROABS 14.0* 16.2* 13.0* 10.4* 9.7*  HGB 10.2* 10.3* 10.4* 10.0* 10.8*  HCT 30.1* 30.7* 29.9* 28.9* 30.6*  MCV 87.2 88.5 88.2 88.4 86.2  PLT 411* 457* 420* 406* 456*   Basic Metabolic Panel: Recent Labs  Lab 01/22/18 2031  01/24/18 0533 01/25/18 0535 01/27/18 0420 01/28/18 0650 01/29/18 0615  NA 132*   < > 132* 132* 133* 133* 135  K 4.0   < > 3.8 3.6 3.7  3.8 4.0  CL 98*   < > 101 100* 97* 98* 98*  CO2 24   < > 23 24 26 25 23   GLUCOSE 146*   < > 79 89 83 84 87  BUN 10   < > 8 7 10 10 11   CREATININE 0.64   < > 0.46* 0.50* 0.50* 0.49* 0.36*  CALCIUM 8.0*   < > 7.6* 7.7* 8.1* 8.4* 9.0  MG 1.9  --   --   --   --   --   --   PHOS 3.4  --   --   --   --   --   --    < > = values in this interval not displayed.   GFR: Estimated Creatinine Clearance: 136.1 mL/min (A) (by C-G formula based on SCr of 0.36 mg/dL (L)).   Liver Function Tests: Recent Labs  Lab 01/25/18 0535 01/26/18 0422 01/27/18 0420 01/28/18 0650 01/29/18 0615  AST 142* 144* 112* 98* 121*  ALT 66* 62 53 47 56  ALKPHOS 374* 382* 347* 303* 357*  BILITOT 14.9* 14.3* 16.3* 17.3* 19.5*  PROT 7.2 7.1 7.3 7.6 8.4*  ALBUMIN 1.3* 1.2* 1.7* 2.4* 2.4*   Coagulation Profile: Recent Labs  Lab 01/22/18 2031 01/23/18 1151 01/24/18 0533  INR 7.38* 1.53 1.45  Sepsis Labs: Recent Labs  Lab 01/22/18 2031  LATICACIDVEN 1.8    Recent Results (from the past 240 hour(s))  Blood culture (routine x 2)     Status: None   Collection Time: 01/22/18  8:31 PM  Result Value Ref Range Status   Specimen Description BLOOD RIGHT FOREARM  Final   Special Requests   Final    BOTTLES DRAWN AEROBIC AND ANAEROBIC Blood Culture adequate volume   Culture   Final    NO GROWTH 5 DAYS Performed at [facility], [address], [place], [place] [address]    Report Status 01/27/2018 FINAL  Final  Blood culture (routine x 2)     Status: None   Collection Time: 01/22/18  8:43 PM  Result Value Ref Range Status   Specimen Description BLOOD LEFT FOREARM  Final   Special Requests   Final    BOTTLES DRAWN AEROBIC ONLY Blood Culture adequate volume   Culture   Final    NO GROWTH 5 DAYS Performed at [facility], [address] [place], [place] [address]    Report Status 01/27/2018 FINAL  Final  MRSA PCR Screening     Status: None   Collection Time: 01/24/18  7:06 AM  Result Value Ref Range Status     MRSA by PCR NEGATIVE NEGATIVE Final    Comment:        The GeneXpert MRSA Assay (FDA approved for NASAL specimens only), is one component of a comprehensive MRSA colonization surveillance program. It is not intended to diagnose MRSA infection nor to guide or monitor treatment for MRSA infections. Performed at [facility], [address], [place], [place] [address]      Radiology Studies: No results found.  Scheduled Meds: . cephALEXin  500 mg Oral Q8H  . doxycycline  100 mg Oral Q12H  . furosemide  40 mg Oral Q breakfast  . spironolactone  100 mg Oral Q breakfast   Continuous Infusions:    LOS: 7 days    Time spent: 30 minutes    Vassie Loll, MD [facility] Pager [phone]  If 7PM-7AM, please contact night-coverage [email or web address] Password [facility] 01/29/2018, 4:06 PM

## 2018-01-29 NOTE — Progress Notes (Signed)
  Subjective:  Patient has no complaints.  He states he has very good appetite.  He feels full in his abdomen but denies pain.  He had a bowel movement.  He denies melena or rectal bleeding.  Feels tight in his left leg but denies pain.  Objective: Blood pressure 113/78, pulse 85, temperature 97.7 F (36.5 C), temperature source Oral, resp. rate 18, height 6' (1.829 m), weight 174 lb 13.2 oz (79.3 kg), SpO2 100 %. Patient is alert and does not have asterixis. He remains deeply icteric. Abdomen is full but soft and nontender without organomegaly or masses. He has 2+ pitting edema to left foot and leg is slightly warm.  No edema noted to right leg.  Labs/studies Results:  Recent Labs    01/27/18 0420 01/28/18 0650 01/29/18 0615  WBC 17.1* 13.7* 12.8*  HGB 10.4* 10.0* 10.8*  HCT 29.9* 28.9* 30.6*  PLT 420* 406* 456*    BMET  Recent Labs    01/27/18 0420 01/28/18 0650 01/29/18 0615  NA 133* 133* 135  K 3.7 3.8 4.0  CL 97* 98* 98*  CO2 26 25 23   GLUCOSE 83 84 87  BUN 10 10 11   CREATININE 0.50* 0.49* 0.36*  CALCIUM 8.1* 8.4* 9.0    LFT  Recent Labs    01/27/18 0420 01/28/18 0650 01/29/18 0615  PROT 7.3 7.6 8.4*  ALBUMIN 1.7* 2.4* 2.4*  AST 112* 98* 121*  ALT 53 47 56  ALKPHOS 347* 303* 357*  BILITOT 16.3* 17.3* 19.5*    ACE level pending.  Assessment:  #1.  Cholestatic liver disease.  Primary sclerosing cholangitis remains a concern given MRCP appearance of intrahepatic biliary system.  Patient does not have underlying inflammatory bowel disease.  He has multiple other positive markers such as AMA and SMA but these would appear to be nonspecific in the setting of chronic liver disease.  Continuing rise in bilirubin remains a concern.  #2.  Left leg cellulitis.  Patient is now on p.o. antibiotics.  He is on cephalexin and doxycycline.  There is potential for hepatotoxicity with doxycycline.  If bili and transaminases keep going up we may have to switch an  antibiotic.  #3.  Fluid overload.  His weight 3 weeks ago was 170 pounds.  In the last few days he has lost 17 pounds.  He still has some edema to left leg and he may have mild ascites.  #4.  Anemia.  H&H is stable   Recommendations:  LFTs and INR in a.m. He remains stable he may be able to go home tomorrow.

## 2018-01-30 ENCOUNTER — Telehealth: Payer: Self-pay | Admitting: Gastroenterology

## 2018-01-30 ENCOUNTER — Other Ambulatory Visit: Payer: Self-pay | Admitting: *Deleted

## 2018-01-30 DIAGNOSIS — E8809 Other disorders of plasma-protein metabolism, not elsewhere classified: Secondary | ICD-10-CM

## 2018-01-30 DIAGNOSIS — K8301 Primary sclerosing cholangitis: Secondary | ICD-10-CM

## 2018-01-30 DIAGNOSIS — K7460 Unspecified cirrhosis of liver: Secondary | ICD-10-CM

## 2018-01-30 LAB — CBC WITH DIFFERENTIAL/PLATELET
BASOS PCT: 1 %
Basophils Absolute: 0.1 10*3/uL (ref 0.0–0.1)
EOS PCT: 3 %
Eosinophils Absolute: 0.4 10*3/uL (ref 0.0–0.7)
HEMATOCRIT: 32.7 % — AB (ref 39.0–52.0)
HEMOGLOBIN: 11 g/dL — AB (ref 13.0–17.0)
LYMPHS ABS: 1.5 10*3/uL (ref 0.7–4.0)
Lymphocytes Relative: 11 %
MCH: 30 pg (ref 26.0–34.0)
MCHC: 33.6 g/dL (ref 30.0–36.0)
MCV: 89.1 fL (ref 78.0–100.0)
MONO ABS: 1.2 10*3/uL — AB (ref 0.1–1.0)
Monocytes Relative: 9 %
NEUTROS ABS: 10.1 10*3/uL — AB (ref 1.7–7.7)
Neutrophils Relative %: 76 %
Platelets: 420 10*3/uL — ABNORMAL HIGH (ref 150–400)
RBC: 3.67 MIL/uL — ABNORMAL LOW (ref 4.22–5.81)
RDW: 17.1 % — ABNORMAL HIGH (ref 11.5–15.5)
WBC: 13.3 10*3/uL — ABNORMAL HIGH (ref 4.0–10.5)

## 2018-01-30 LAB — BASIC METABOLIC PANEL
Anion gap: 12 (ref 5–15)
BUN: 12 mg/dL (ref 6–20)
CALCIUM: 8.8 mg/dL — AB (ref 8.9–10.3)
CO2: 24 mmol/L (ref 22–32)
Chloride: 98 mmol/L — ABNORMAL LOW (ref 101–111)
Creatinine, Ser: 0.52 mg/dL — ABNORMAL LOW (ref 0.61–1.24)
GFR calc Af Amer: >60 mL/min (ref >60)
GLUCOSE: 80 mg/dL (ref 65–99)
POTASSIUM: 4.3 mmol/L (ref 3.5–5.1)
Sodium: 134 mmol/L — ABNORMAL LOW (ref 135–145)

## 2018-01-30 LAB — HEPATIC FUNCTION PANEL
ALK PHOS: 349 U/L — AB (ref 38–126)
ALT: 57 U/L (ref 17–63)
AST: 123 U/L — ABNORMAL HIGH (ref 15–41)
Albumin: 2.1 g/dL — ABNORMAL LOW (ref 3.5–5.0)
BILIRUBIN DIRECT: 11.4 mg/dL — AB (ref 0.1–0.5)
BILIRUBIN TOTAL: 18.8 mg/dL — AB (ref 0.3–1.2)
Indirect Bilirubin: 7.4 mg/dL — ABNORMAL HIGH (ref 0.3–0.9)
Total Protein: 8.1 g/dL (ref 6.5–8.1)

## 2018-01-30 LAB — ANGIOTENSIN CONVERTING ENZYME: Angiotensin-Converting Enzyme: 88 U/L — ABNORMAL HIGH (ref 14–82)

## 2018-01-30 LAB — PROTIME-INR
INR: 1.38
PROTHROMBIN TIME: 16.9 s — AB (ref 11.4–15.2)

## 2018-01-30 MED ORDER — SPIRONOLACTONE 100 MG PO TABS: 100.0000 mg | ORAL_TABLET | Freq: Every day | ORAL | 2 refills | Status: AC

## 2018-01-30 MED ORDER — CEPHALEXIN 500 MG PO CAPS: 500.0000 mg | ORAL_CAPSULE | Freq: Three times a day (TID) | ORAL | 0 refills | Status: DC

## 2018-01-30 MED ORDER — DOXYCYCLINE HYCLATE 100 MG PO TABS: 100.0000 mg | ORAL_TABLET | Freq: Two times a day (BID) | ORAL | 0 refills | Status: DC

## 2018-01-30 MED ORDER — FUROSEMIDE 40 MG PO TABS: 40.0000 mg | ORAL_TABLET | Freq: Every day | ORAL | 2 refills | Status: AC

## 2018-01-30 NOTE — Progress Notes (Signed)
IV removed, 2x2 gauze and paper tape applied to site, patient tolerated well. Reviewed disharge information/AVS with patient who verbalized understanding.

## 2018-01-30 NOTE — Progress Notes (Signed)
Spoke with Dr. Julieta GuttingHayashi via phone. He is aware of patient, and he also has patient's phone number. He feels patient will likely be able to have an appt early next week, but he will look at his schedule. [facility]. I also left message with Theophilus KindsLisa Hardee, RN, as an FYI that referral would be coming through (she works with Dr. Julieta GuttingHayashi). We will arrange close follow-up in the office, as he will need colonoscopy/EGD. Patient may be discharged today. We will arrange repeat BMP, INR, HFP around Wednesday.   Gelene MinkAnna W. Joshoa Shawler, PhD, ANP-BC [facility]

## 2018-01-30 NOTE — Discharge Summary (Signed)
Physician Discharge Summary  Reginald Richardson FAO:[id number] DOB: Mar 07, 1976 DOA: 01/22/2018  PCP: Patient, No Pcp Per  Admit date: 01/22/2018 Discharge date: 01/30/2018  Time spent: 35 minutes  Recommendations for Outpatient Follow-up:  1. Repeat PT/INR, PTT and hepatic panel  2. Follow volume status and decide on further work up/treatment for [facility] 3. Outpatient EGD/colonoscopy screening.   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  Patient having discharged home with instruction to follow-up with GI service on Feb 08, 2018 and also to follow-up at the liver transplant clinic [facility] (last one will contact patient with appointment details).  Diet recommendation: Low sodium diet.  Filed Weights   01/28/18 0500 01/29/18 0453 01/30/18 0455  Weight: 80.3 kg (177 lb 0.5 oz) 79.3 kg (174 lb 13.2 oz) 77.6 kg (171 lb 1.2 oz)    History of present illness:  As per Dr. Robb Matar on 01/22/18 42 y.o. male with no previous medical history who is coming to the emergency department due to lower extremity swelling for 2 weeks and 2 days of left foot pain/worsening swelling.  He noticed having swelling about 2 weeks ago and came to the ED.  Since then, he was using compression wraps on his legs and was improving until Friday when he noticed that his left foot was swelling and developing discomfort.  Since then, the swelling has increased and the tenderness has increased in intensity.  The area has also developed erythema.  On Saturday, the tenderness got very intense and he had shakes and chills for a short period.  He denies night sweats.  He denies  trauma to the area.  He has had chronic jaundice and skin lesions for a lot of months or almost a year, according to his mother.  He has frequent pruritus and has multiple skin abrasions from scratching.  He recently started having occasional bleeding gums.  He does not have a PCP and has not establish since he was seen in the ED on 01/07/2018.  He denies headache, rhinorrhea, sore throat, dyspnea, hemoptysis, cough, chest pain, palpitations, dizziness, PND, orthopnea or pitting edema of the lower extremities.  He denies abdominal pain, nausea, vomiting, constipation, melena or hematochezia.  He has been getting soft to mildly loose stools BMs recently.  He states that his urine looks very dark, but denies dysuria or frequency.  No polyphagia, polydipsia or polyuria.  ED Course: Initial vital signs temperature 37.1C (98.7 F), pulse 116, respiration 16, blood pressure 143/93 mmHg and O2 sat 99% on room air.  He was given 1000 mg of vancomycin in the emergency department.  Hospital Course:  1. Left lower extremity cellulitis:  No fever, WBC's has trended down to 12-13K at discharge; patient continued to demonstrate improvement in the pain, erythema and swelling of his left lower extremity. Will continue PO antibiotics (4 days of treatment pending), leg elevation and treatment for 3rd spacing as specified below.  Continue to follow clinical response.   2. Obstructive jaundice with cirrhosis.  LFTs has remained stable/slightly decreased. appreciate assistance from gastroenterology service and will follow further recommendations. 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  GI service has contacted hepatologist unit at [facility] and the plan is to continue current treatment for stabilization, no biopsy to be done during this admission.  Expedite outpatient follow-up at the transplant unit at [facility] to be arranged by GI.  Patient discharge home  on lasix and spironolactone. Bilirubin level down to 18 at time of discharge. 3. 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  INR was initially elevated with a subsequent return to normal after vitamin K was given.  There is no signs of acute bleeding.  Continue to follow clinical response. Repeat PT/INR and PTT at follow up visit. 4. Anemia.  Overall stable.  Continue intermittent CBC to follow hemoglobin trend.  No signs of acute bleeding appreciated. 5. Hyponatremia/hypoalbuminemia.  Likely secondary to diarrhea, overall decreased oral intake and dilutional effect.  Patient has completed albumin infusion; continue lasix and spironolactone as dictated by GI. Repeat CMET at follow up visit. 6. Severe protein calorie malnutrition: appreciated nutritional service recommendations for feeding supplements.      Procedures:  See below for x-ray reports   Consultations:  GI service   Discharge Exam: Vitals:   01/29/18 2105 01/30/18 0458  BP: 112/63 (!) 112/51  Pulse: 92 96  Resp:    Temp: 98.3 F (36.8 C) 97.8 F (36.6 C)  SpO2: 97% 100%   General exam: No fever, no chest pain, no shortness of breath.  Overall swelling, erythema and pain in his left lower extremity continued to improve.  He continued to have jaundice and icterus.  Bilirubin level rising. Respiratory system: Good air movement bilaterally, no wheezing, no crackles, no using accessory muscles.   Cardiovascular system: S1 and S2, no rubs, no gallops, no JVD.   Gastrointestinal system: Mild distention appreciated on exam, no tenderness, no guarding, positive bowel sounds, no palpable masses on exam.  Central nervous system: Cranial nerves grossly intact, patient is alert, awake and oriented x3, no focal deficits, able to follow commands appropriately.   Extremities: 1+ edema appreciated on his lower extremities bilaterally (left more than right), no open wounds, no drainage, significant improvement in  previous appreciated erythematous changes from cellulitic process.  Patient reports just mild pain with deep palpation and bearing weight.  Skin: Significant improvement on his erythema process appreciated left lower extremity.  There is no other open wounds, induration, petechiae, or rash.  As mentioned above he has jaundice. Psychiatry: Good judgment and preserved insight, no suicidal ideation, stable mood.  Discharge Instructions   Discharge Instructions    Ambulatory referral to Physical Therapy   Complete by:  As directed    Diet - low sodium heart healthy   Complete by:  As directed    Discharge instructions   Complete by:  As directed    Follow low sodium diet  Maintain adequate hydration Take medications as prescribed  Please follow up with GI service as instructed and also follow up with [facility] Liver transplant service (they would contact you with appointment details)     Allergies as of 01/30/2018   No Known Allergies     Medication List    TAKE these medications   cephALEXin 500 MG capsule Commonly known as:  KEFLEX Take 1 capsule (500 mg total) by mouth every 8 (eight) hours.   doxycycline 100 MG tablet Commonly known as:  VIBRA-TABS Take 1 tablet (100 mg total) by mouth every 12 (twelve) hours.   furosemide 40 MG tablet  Commonly known as:  LASIX Take 1 tablet (40 mg total) by mouth daily with breakfast. Start taking on:  01/31/2018   spironolactone 100 MG tablet Commonly known as:  ALDACTONE Take 1 tablet (100 mg total) by mouth daily with breakfast. Start taking on:  01/31/2018 What changed:    medication strength  how much to take  when to take this            Durable Medical Equipment  (From admission, onward)        Start     Ordered   01/25/18 1520  For home use only DME Walker rolling  Once    Question:  Patient needs a walker to treat with the following condition  Answer:  General weakness   01/25/18 1519     No Known  Allergies Follow-up Information    Corbin Adeourk, Robert M, MD. Go on 02/08/2018.   Specialty:  Gastroenterology Contact information: [address] [place] [address] [phone]           The results of significant diagnostics from this hospitalization (including imaging, microbiology, ancillary and laboratory) are listed below for reference.    Significant Diagnostic Studies: Dg Chest 2 View  Result Date: 01/07/2018 CLINICAL DATA:  Bilateral leg swelling. EXAM: CHEST - 2 VIEW COMPARISON:  None. FINDINGS: Atelectasis in the right lung base and right middle lobe. The heart, hila, mediastinum, lungs, and pleura are otherwise normal. IMPRESSION: Atelectasis on the right.  No other acute abnormalities. Electronically Signed   By: Gerome Samavid  Williams III M.D   On: 01/07/2018 11:48   Ct Abdomen Pelvis W Contrast  Result Date: 01/22/2018 CLINICAL DATA:  Subacute onset of bilateral leg and foot swelling and pain. EXAM: CT ABDOMEN AND PELVIS WITH CONTRAST TECHNIQUE: Multidetector CT imaging of the abdomen and pelvis was performed using the standard protocol following bolus administration of intravenous contrast. CONTRAST:  100mL ISOVUE-300 IOPAMIDOL (ISOVUE-300) INJECTION 61% COMPARISON:  None. FINDINGS: Lower chest: The visualized lung bases are grossly clear. The visualized portions of the mediastinum are unremarkable. Hepatobiliary: There is a nodular contour to the liver, reflecting hepatic cirrhosis. Several masslike areas are noted within the liver, measuring 6.6 cm at the hepatic hilum and 4.8 cm at the left hepatic lobe. Malignancy cannot be excluded. The gallbladder is grossly unremarkable, though difficult to fully assess given surrounding ascites. The common bile duct is normal in caliber. Pancreas: The pancreas is within normal limits. Spleen: The spleen is unremarkable in appearance. Adrenals/Urinary Tract: The adrenal glands are unremarkable in appearance. A nonobstructing 4 mm stone is noted  at the upper pole of the left kidney. There is no evidence of hydronephrosis. No obstructing ureteral stones are identified. No perinephric stranding is seen. Stomach/Bowel: Varices are noted about the distal esophagus and stomach. The stomach is unremarkable in appearance. The small bowel is within normal limits. The appendix is normal in caliber, without evidence of appendicitis. There is vague edema involving the wall of the colon, likely reflecting underlying hypoalbuminemia. The colon is otherwise unremarkable. Vascular/Lymphatic: The abdominal aorta is unremarkable in appearance. The inferior vena cava is grossly unremarkable. No retroperitoneal lymphadenopathy is seen. No pelvic sidewall lymphadenopathy is identified. Reproductive: The bladder is mildly distended and grossly unremarkable. The prostate is normal in size. Other: Small volume ascites is noted within the abdomen and pelvis. Mildly prominent varices are seen tracking along the anterior abdominal wall and within the omentum. Musculoskeletal: No acute osseous abnormalities are identified. The visualized musculature is unremarkable in  appearance. IMPRESSION: 1. Several masslike areas noted within the liver, measuring 6.6 cm at the hepatic hilum and 4.8 cm at the left hepatic lobe. Malignancy cannot be excluded. Dynamic liver protocol MRI or CT would be helpful for further evaluation. 2. Findings of hepatic cirrhosis. Small volume ascites noted within the abdomen and pelvis. 3. 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 Varices noted about the distal esophagus and stomach. 4. Vague edema involving the wall of the colon likely reflects underlying hypoalbuminemia. 5. Nonobstructing 4 mm stone at the upper pole of the left kidney. Electronically Signed   By: Roanna Raider M.D.   On: 01/22/2018 23:24   Mr Liver W Wo Contrast  Result Date: 01/23/2018 CLINICAL DATA:  Bilateral leg and foot swelling and  pain. Abnormal CT of the liver. EXAM: MRI ABDOMEN WITHOUT AND WITH CONTRAST TECHNIQUE: Multiplanar multisequence MR imaging of the abdomen was performed both before and after the administration of intravenous contrast. CONTRAST:  17mL MULTIHANCE GADOBENATE DIMEGLUMINE 529 MG/ML IV SOLN COMPARISON:  CT 01/22/2018 FINDINGS: Lower chest: No acute findings. Hepatobiliary: Sub optimal arterial phase and portal venous phase enhancement. The liver has a nodular contour and there is hypertrophy of the lateral segment of left lobe of liver. Within segment 7 wedge-shaped area of relative decreased T1 signal with retraction of the liver capsule compatible with focal confluent fibrosis, image 24/series 4004. Mild-to-moderate diffuse and irregular intrahepatic biliary dilatation. Some of the dilated biliary radicles have a beaded appearance, image 253. Increased periportal T2 signal identified. No arterial phase enhancing lesions identified. On the 5 minutes delayed images there are no focal areas of washout identified. Gallbladder unremarkable. Mild intrahepatic bile duct dilatation measures up to 7 mm. No choledocholithiasis identified. Pancreas: No mass, inflammatory changes, or other parenchymal abnormality identified. Spleen: The spleen measures 10 x 6.3 by 13 cm (volume = 430 cm^3). Adrenals/Urinary Tract: The adrenal glands are normal. Unremarkable appearance of the kidneys. Stomach/Bowel: Visualized portions within the abdomen are unremarkable. Vascular/Lymphatic: Normal appearance the abdominal aorta. The main portal vein is patent. The abdominal aorta is normal in appearance. No aneurysm. Diffuse esophageal and gastric varices identified. The main portal vein and its branches appear patent. Enlarged peripancreatic lymph node measures 1.8 cm, image 50/5006. Other: There is a large volume of ascites within the abdomen and pelvis. Musculoskeletal: No abnormal signal from within the bone marrow. IMPRESSION: 1. Morphologic  features of liver compatible with cirrhosis. There is stigmata of portal venous hypertension including mild splenomegaly, varicosities and ascites. 2. Intrahepatic biliary dilatation. Some intrahepatic bile ducts have a beaded appearance which is suggestive of primary biliary cirrhosis. Correlation with tissue sampling is advised. 3. No focal enhancing liver abnormalities to suggest hepatoma. Area of focal confluent fibrosis is identified within segment 7 of the liver. Electronically Signed   By: Signa Kell M.D.   On: 01/23/2018 08:48   Mr Abdomen Mrcp Wo Contrast  Result Date: 01/25/2018 CLINICAL DATA:  Decompensated cirrhosis. Abnormal liver function tests. Bilirubin equal 14 EXAM: MRI ABDOMEN WITHOUT CONTRAST  (INCLUDING MRCP) TECHNIQUE: Multiplanar multisequence MR imaging of the abdomen was performed. Heavily T2-weighted images of the biliary and pancreatic ducts were obtained, and three-dimensional MRCP images were rendered by post processing. COMPARISON:  MRI abdomen 01/23/2018, CT 01/22/2018 FINDINGS: Exam is severely degraded by patient motion. Lower chest:  Lung bases are clear. Hepatobiliary: Morphologic changes in liver consistent cirrhosis again noted. Common hepatic duct and common bile duct are distended to a  similar degree to comparison MRI. Difficult to measure but the common hepatic duct measures 12 mm (image 26/5) and the common bile duct measures approximately 8 mm. No filling defects within ducts noted. There is duct dilatation segmentally within the RIGHT hepatic lobe (segment 8). No additional intrahepatic duct dilatation. These findings are not changed. The gallbladder is elongated with mild gallbladder wall thickening related to ascites. Pancreas: Normal pancreatic parenchymal intensity. No ductal dilatation or inflammation. Spleen: Splenomegaly Adrenals/urinary tract: Adrenal glands and kidneys are normal. Stomach/Bowel: Stomach and limited of the small bowel is unremarkable  Vascular/Lymphatic: Abdominal aortic normal caliber. No retroperitoneal periportal lymphadenopathy. Musculoskeletal: No aggressive osseous lesion IMPRESSION: 1. Exam is severely degraded by patient motion. 2. No significant change from MRI 2 days prior. 3. Mild to moderate extrahepatic duct dilatation. No significant intrahepatic duct dilatation. No obstructing lesion identified. 4. Moderate volume ascites. Electronically Signed   By: Genevive Bi M.D.   On: 01/25/2018 17:52   Mr 3d Recon At Scanner  Result Date: 01/25/2018 CLINICAL DATA:  Decompensated cirrhosis. Abnormal liver function tests. Bilirubin equal 14 EXAM: MRI ABDOMEN WITHOUT CONTRAST  (INCLUDING MRCP) TECHNIQUE: Multiplanar multisequence MR imaging of the abdomen was performed. Heavily T2-weighted images of the biliary and pancreatic ducts were obtained, and three-dimensional MRCP images were rendered by post processing. COMPARISON:  MRI abdomen 01/23/2018, CT 01/22/2018 FINDINGS: Exam is severely degraded by patient motion. Lower chest:  Lung bases are clear. Hepatobiliary: Morphologic changes in liver consistent cirrhosis again noted. Common hepatic duct and common bile duct are distended to a similar degree to comparison MRI. Difficult to measure but the common hepatic duct measures 12 mm (image 26/5) and the common bile duct measures approximately 8 mm. No filling defects within ducts noted. There is duct dilatation segmentally within the RIGHT hepatic lobe (segment 8). No additional intrahepatic duct dilatation. These findings are not changed. The gallbladder is elongated with mild gallbladder wall thickening related to ascites. Pancreas: Normal pancreatic parenchymal intensity. No ductal dilatation or inflammation. Spleen: Splenomegaly Adrenals/urinary tract: Adrenal glands and kidneys are normal. Stomach/Bowel: Stomach and limited of the small bowel is unremarkable Vascular/Lymphatic: Abdominal aortic normal caliber. No retroperitoneal  periportal lymphadenopathy. Musculoskeletal: No aggressive osseous lesion IMPRESSION: 1. Exam is severely degraded by patient motion. 2. No significant change from MRI 2 days prior. 3. Mild to moderate extrahepatic duct dilatation. No significant intrahepatic duct dilatation. No obstructing lesion identified. 4. Moderate volume ascites. Electronically Signed   By: Genevive Bi M.D.   On: 01/25/2018 17:52   US Venous Img Lower Bilateral  Result Date: 01/23/2018 CLINICAL DATA:  42 year old male with a history of leg swelling/edema EXAM: BILATERAL LOWER EXTREMITY VENOUS DOPPLER ULTRASOUND TECHNIQUE: Gray-scale sonography with graded compression, as well as color Doppler and duplex ultrasound were performed to evaluate the lower extremity deep venous systems from the level of the common femoral vein and including the common femoral, femoral, profunda femoral, popliteal and calf veins including the posterior tibial, peroneal and gastrocnemius veins when visible. The superficial great saphenous vein was also interrogated. Spectral Doppler was utilized to evaluate flow at rest and with distal augmentation maneuvers in the common femoral, femoral and popliteal veins. COMPARISON:  None. FINDINGS: RIGHT LOWER EXTREMITY Common Femoral Vein: No evidence of thrombus. Normal compressibility, respiratory phasicity and response to augmentation. Saphenofemoral Junction: No evidence of thrombus. Normal compressibility and flow on color Doppler imaging. Profunda Femoral Vein: No evidence of thrombus. Normal compressibility and flow on color Doppler imaging. Femoral Vein: No evidence of  thrombus. Normal compressibility, respiratory phasicity and response to augmentation. Popliteal Vein: No evidence of thrombus. Normal compressibility, respiratory phasicity and response to augmentation. Calf Veins: No evidence of thrombus. Normal compressibility and flow on color Doppler imaging. Superficial Great Saphenous Vein: No evidence of  thrombus. Normal compressibility and flow on color Doppler imaging. Other Findings:  Edema LEFT LOWER EXTREMITY Common Femoral Vein: No evidence of thrombus. Normal compressibility, respiratory phasicity and response to augmentation. Saphenofemoral Junction: No evidence of thrombus. Normal compressibility and flow on color Doppler imaging. Profunda Femoral Vein: No evidence of thrombus. Normal compressibility and flow on color Doppler imaging. Femoral Vein: No evidence of thrombus. Normal compressibility, respiratory phasicity and response to augmentation. Popliteal Vein: No evidence of thrombus. Normal compressibility, respiratory phasicity and response to augmentation. Calf Veins: No evidence of thrombus. Normal compressibility and flow on color Doppler imaging. Superficial Great Saphenous Vein: No evidence of thrombus. Normal compressibility and flow on color Doppler imaging. Other Findings:  Edema IMPRESSION: Sonographic survey of the bilateral lower extremities negative for DVT. Bilateral lower extremity edema Electronically Signed   By: Gilmer Mor D.O.   On: 01/23/2018 10:26   [place] Ascites (abdomen Limited)  Result Date: 01/24/2018 CLINICAL DATA:  Cirrhosis, ascites EXAM: LIMITED ABDOMEN ULTRASOUND FOR ASCITES TECHNIQUE: Limited ultrasound survey for ascites was performed in all four abdominal quadrants. COMPARISON:  CT abdomen 01/22/2018 FINDINGS: Minimal scattered ascites throughout the abdomen. Visualized ascites is insufficient for paracentesis. IMPRESSION: Insufficient ascites for paracentesis. Electronically Signed   By: Ulyses Southward M.D.   On: 01/24/2018 11:40    Microbiology: Recent Results (from the past 240 hour(s))  Blood culture (routine x 2)     Status: None   Collection Time: 01/22/18  8:31 PM  Result Value Ref Range Status   Specimen Description BLOOD RIGHT FOREARM  Final   Special Requests   Final    BOTTLES DRAWN AEROBIC AND ANAEROBIC Blood Culture adequate volume   Culture    Final    NO GROWTH 5 DAYS Performed at [facility], [address] [place], [place] [address]    Report Status 01/27/2018 FINAL  Final  Blood culture (routine x 2)     Status: None   Collection Time: 01/22/18  8:43 PM  Result Value Ref Range Status   Specimen Description BLOOD LEFT FOREARM  Final   Special Requests   Final    BOTTLES DRAWN AEROBIC ONLY Blood Culture adequate volume   Culture   Final    NO GROWTH 5 DAYS Performed at [facility], [address] [place], [place] [address]    Report Status 01/27/2018 FINAL  Final  MRSA PCR Screening     Status: None   Collection Time: 01/24/18  7:06 AM  Result Value Ref Range Status   MRSA by PCR NEGATIVE NEGATIVE Final    Comment:        The GeneXpert MRSA Assay (FDA approved for NASAL specimens only), is one component of a comprehensive MRSA colonization surveillance program. It is not intended to diagnose MRSA infection nor to guide or monitor treatment for MRSA infections. Performed at [facility], [address] [place], [place] [address]     Labs: Basic Metabolic Panel: Recent Labs  Lab 01/25/18 0535 01/27/18 0420 01/28/18 0650 01/29/18 0615 01/30/18 0430  NA 132* 133* 133* 135 134*  K 3.6 3.7 3.8 4.0 4.3  CL 100* 97* 98* 98* 98*  CO2 24 26 25 23 24   GLUCOSE 89 83 84 87 80  BUN 7 10 10 11 12   CREATININE 0.50* 0.50* 0.49* 0.36* 0.52*  CALCIUM 7.7* 8.1* 8.4* 9.0 8.8*   Liver Function Tests: Recent Labs  Lab 01/26/18 0422 01/27/18 0420 01/28/18 0650 01/29/18 0615 01/30/18 0430  AST 144* 112* 98* 121* 123*  ALT 62 53 47 56 57  ALKPHOS 382* 347* 303* 357* 349*  BILITOT 14.3* 16.3* 17.3* 19.5* 18.8*  PROT 7.1 7.3 7.6 8.4* 8.1  ALBUMIN 1.2* 1.7* 2.4* 2.4* 2.1*   CBC: Recent Labs  Lab 01/26/18 0422 01/27/18 0420 01/28/18 0650 01/29/18 0615 01/30/18 0430  WBC 20.4* 17.1* 13.7* 12.8* 13.3*  NEUTROABS 16.2* 13.0* 10.4* 9.7* 10.1*  HGB 10.3* 10.4* 10.0* 10.8* 11.0*  HCT 30.7* 29.9* 28.9* 30.6*  32.7*  MCV 88.5 88.2 88.4 86.2 89.1  PLT 457* 420* 406* 456* 420*    BNP (last 3 results) Recent Labs    01/07/18 1123  BNP 34.0    Signed:  Vassie Loll MD.  [facility] 01/30/2018, 1:32 PM

## 2018-01-30 NOTE — Progress Notes (Addendum)
    Subjective: No abdominal pain or confusion. Good appetite. Lower extremity edema improving.   Objective: Vital signs in last 24 hours: Temp:  [97.8 F (36.6 C)-98.3 F (36.8 C)] 97.8 F (36.6 C) (04/22 0458) Pulse Rate:  [57-96] 96 (04/22 0458) Resp:  [16] 16 (04/21 1339) BP: (96-112)/(51-63) 112/51 (04/22 0458) SpO2:  [97 %-100 %] 100 % (04/22 0458) Weight:  [171 lb 1.2 oz (77.6 kg)] 171 lb 1.2 oz (77.6 kg) (04/22 0455) Last BM Date: 01/29/18 General:   Alert and oriented, pleasant Eyes:  +scleral icterus  Abdomen:  Bowel sounds present, soft but mildly distended, similar to prior exams, with anasarca.  Extremities:  Improved lower extremity edema, pedal edema. Left > than right, chronic.  Neurologic:  Alert and  oriented x4 Psych:  Alert and cooperative. Normal mood and affect.  Intake/Output from previous day: 04/21 0701 - 04/22 0700 In: 240 [P.O.:240] Out: 1500 [Urine:1500] Intake/Output this shift: No intake/output data recorded.  Lab Results: Recent Labs    01/28/18 0650 01/29/18 0615 01/30/18 0430  WBC 13.7* 12.8* 13.3*  HGB 10.0* 10.8* 11.0*  HCT 28.9* 30.6* 32.7*  PLT 406* 456* 420*   BMET Recent Labs    01/28/18 0650 01/29/18 0615  NA 133* 135  K 3.8 4.0  CL 98* 98*  CO2 25 23  GLUCOSE 84 87  BUN 10 11  CREATININE 0.49* 0.36*  CALCIUM 8.4* 9.0   LFT Recent Labs    01/28/18 0650 01/29/18 0615 01/30/18 0430  PROT 7.6 8.4* 8.1  ALBUMIN 2.4* 2.4* 2.1*  AST 98* 121* 123*  ALT 47 56 57  ALKPHOS 303* 357* 349*  BILITOT 17.3* 19.5* 18.8*  BILIDIR  --   --  11.4*  IBILI  --   --  7.4*   PT/INR Recent Labs    01/30/18 0430  LABPROT 16.9*  INR 1.38   Assessment: 42 year old male admitted with decompensated advanced liver disease in setting of lower extremity cellulitis. 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. [facility] will need [facility]. MELD Na 24 on 4/16. Update BMP to calculate MELD today with most recent labs.   Bilirubin 19.5 yesterday but decreased today. INR remains stable. Diuresing well, weight 171 and trending down. Renal function preserved.  Similar to admitting weight on 4/14 (max of 203 this admission). Albumin 2.1 today, slightly decreased from past 24-48 hours after rounds of IV albumin; however, overall improved from admitting albumin of 1.4.   Will need outpatient colonoscopy and EGD. [facility] in the near future. In discussion with [facility] today and will update chart once obtained.    Plan: Continue Lasix 40 mg and aldactone 100 mg daily BMP today Outpatient consultation with transplant [facility] to be arranged ASAP Outpatient colonoscopy/EGD Will update chart with appt information as soon as obtained.   Gelene MinkAnna W. Elona Yinger, PhD, ANP-BC [facility]     LOS: 8 days    01/30/2018, 7:48 AM   Addendum at 0955: MELD Na 24. Stable. Gelene MinkAnna W. Kiyonna Tortorelli, PhD, ANP-BC [facility]

## 2018-01-30 NOTE — Telephone Encounter (Signed)
Appt for hospital follow-up on 5/1 with [facility].   Alicia: please have patient complete  BMP, INR, HFP around Wednesday, 4/24. Likely to be discharged today.

## 2018-01-30 NOTE — Progress Notes (Signed)
CRITICAL VALUE ALERT  Critical Value:  Bilirubin 18.8  Date & Time Notied:  01/30/18  0610  Provider Notified: Sherryll BurgerShah  Orders Received/Actions taken: none at this time

## 2018-01-31 ENCOUNTER — Other Ambulatory Visit: Payer: Self-pay

## 2018-01-31 DIAGNOSIS — K7460 Unspecified cirrhosis of liver: Secondary | ICD-10-CM

## 2018-01-31 NOTE — Telephone Encounter (Signed)
Pt notified and orders have been placed. Pt will have lab work done tomorrow.

## 2018-02-01 LAB — HEPATIC FUNCTION PANEL
AG RATIO: 0.6 (calc) — AB (ref 1.0–2.5)
ALKALINE PHOSPHATASE (APISO): 399 U/L — AB (ref 40–115)
ALT: 63 U/L — ABNORMAL HIGH (ref 9–46)
AST: 155 U/L — ABNORMAL HIGH (ref 10–40)
Albumin: 3 g/dL — ABNORMAL LOW (ref 3.6–5.1)
BILIRUBIN TOTAL: 26.6 mg/dL — AB (ref 0.2–1.2)
GLOBULIN: 5.3 g/dL — AB (ref 1.9–3.7)
TOTAL PROTEIN: 8.3 g/dL — AB (ref 6.1–8.1)

## 2018-02-01 LAB — BASIC METABOLIC PANEL
BUN: 16 mg/dL (ref 7–25)
CALCIUM: 8.7 mg/dL (ref 8.6–10.3)
CHLORIDE: 97 mmol/L — AB (ref 98–110)
CO2: 27 mmol/L (ref 20–32)
Creat: 0.76 mg/dL (ref 0.60–1.35)
Glucose, Bld: 60 mg/dL — ABNORMAL LOW (ref 65–139)
POTASSIUM: 4.4 mmol/L (ref 3.5–5.3)
SODIUM: 132 mmol/L — AB (ref 135–146)

## 2018-02-02 LAB — PROTIME-INR
INR: 1.5 — ABNORMAL HIGH
Prothrombin Time: 15.6 s — ABNORMAL HIGH (ref 9.0–11.5)

## 2018-02-03 NOTE — Progress Notes (Signed)
Patient was discharged earlier this week after admission for decompensated cirrhosis, [facility] but [facility] has been [facility]. I had spoken to Dr. Julieta GuttingHayashi earlier this week. LFTs are increasing, with bilirubin now 26. MELD Na 27. INR is inching up but monitor for now. Please contact patient and ensure he is not further decompensated: any signs of confusion, mental status changes, abdominal pain, etc. If so, he needs to seek medical care. When is [facility] app[facility]? Please send these labs marked [facility]. Can we also touch base with Theophilus KindsLisa Hardee, RN, who works with Dr. Julieta GuttingHayashi to inform these are coming. Theophilus KindsLisa Hardee: [phone]. Administrative contact: [phone]

## 2018-02-08 ENCOUNTER — Ambulatory Visit: Payer: PRIVATE HEALTH INSURANCE | Admitting: Nurse Practitioner

## 2018-02-10 NOTE — Progress Notes (Signed)
[name]: please make sure patient has an appt coming up with [place] end of May 2019. He is in evaluation for liver transplantation. Will possibly need EGD here (or he may be undergoing EGD/ERCP at [facility]), but will need colonoscopy definitely when stable. As of note, he did make it to the [facility] appt. I have reviewed the note. Undergoing transplant evaluation and followed closely there.

## 2018-02-13 NOTE — Progress Notes (Signed)
PATIENT CALLED AND RESCHEDULED

## 2018-03-10 ENCOUNTER — Ambulatory Visit (INDEPENDENT_AMBULATORY_CARE_PROVIDER_SITE_OTHER): Payer: PRIVATE HEALTH INSURANCE | Admitting: Nurse Practitioner

## 2018-03-10 ENCOUNTER — Encounter: Payer: Self-pay | Admitting: Nurse Practitioner

## 2018-03-10 VITALS — BP 122/71 | HR 141 | Temp 97.9°F | Ht 72.0 in | Wt 184.8 lb

## 2018-03-10 DIAGNOSIS — L03116 Cellulitis of left lower limb: Secondary | ICD-10-CM | POA: Diagnosis not present

## 2018-03-10 DIAGNOSIS — K7460 Unspecified cirrhosis of liver: Secondary | ICD-10-CM | POA: Diagnosis not present

## 2018-03-10 DIAGNOSIS — K8301 Primary sclerosing cholangitis: Secondary | ICD-10-CM | POA: Diagnosis not present

## 2018-03-10 DIAGNOSIS — R188 Other ascites: Secondary | ICD-10-CM | POA: Diagnosis not present

## 2018-03-10 NOTE — Progress Notes (Signed)
Primary Care Physician:  Patient, No Pcp Per Primary Gastroenterologist:  Dr. Jena Gauss  Chief Complaint  Patient presents with  . Follow-up    abn labs. Was seen at [facility] this past week for screening for liver tansplant    HPI:   Reginald Richardson is a 42 y.o. male who presents for hospital follow-up.  The patient was admitted to the hospital 01/22/2018 through 01/30/2018 for cellulitis, anemia.  GI was consulted due to decompensated liver disease likely due to [facility].  He presented to the ER for lower extremity swelling for 2 weeks.  Has been using compression wraps and was improving until Friday when he noticed left foot swelling and discomfort.  Noted chronic jaundice and skin lesions for a number of months, almost a year according to his mother.  Also noted frequent pruritus.  Some occasional bleeding from his gums.  Left lower extremity cellulitis without fever and white blood cell counts trended down to 12 13,000 and discharge.  Pain and erythema improved.  Continue p.o. antibiotics.  Recommendations of MRCP, CT, ultrasound suggesting PSC.  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  Hepatology at [facility] was contacted and recommended continue current treatment and follow-up with [facility] transplant service.  He was discharged home on Lasix and Spironolactone.  From [facility] 03/08/18:  MELD: 27, Child-Pugh: C  Labs completed posthospitalization as recommended on 02/01/2018 including HFP, BMP, PT/INR.  At that time LFTs noted to be trending up with AST/ALT 155/63, alkaline phosphatase 399, total bilirubin 26.6 and direct bilirubin off the charts that "greater than 10.0".  INR creeping up and found to be 1.5 compared to 1.57-month prior.  There is a question of PSC and referral was made to [facility] liver clinic.  [facility] appointment was scheduled for 02/07/2018.  Meld score calculated at 27.  Reviewed notes in care everywhere.  The patient was last seen by  [facility] service on 03/09/2018 with Dr. Katina Degree and Dr. Townsend Roger.  There is no contraindication to transplantation and the patient decided to proceed with the liver transplant evaluation process. Indication that patient would like to avoid transplant as long as possible, but is agreeable to transplant.  Today he states he's doing ok. Is tired due to 4 days, 8 hours each at [facility]. States he was told he's a good candidate for transplant, he's still at a "functional state" and not to be listed yet, but can be listed when he gets sicker. He states "I haven't been told I'm on the list yet, but I'm close with a score of 28 and you're not ready until you're 30s-40s." Denies abdominal pain, N/V. Not really having itching currently, was given by a Benadryl-type pill which has helped. Denies fever, chills, unintetentional weight loss. Has finished his antibiotics, foot swells some but no more pain or redness. Has ongoing yellowing of his eyes. Bowel movements doing ok. Denies hematochezia, melena, unintentional weight loss. [facility] Hasn't started him on any other medications. He is still having intermittent LE edema, denies abdominal swelling. Denies chest pain, dyspnea, dizziness, lightheadedness, syncope, near syncope. Denies any other upper or lower GI symptoms.  Indicates he was told by [facility] that they would complete colonoscopy/EGD. I am unable to see where they indicated this or who would do it (in their notes).  He states he's already had an irregular heartbeat. EKG completed at [facility] found HR 108, full results below:  Result Narrative  SINUS RHYTHM  WITH FREQUENT AND CONSECUTIVE PACS MINIMAL VOLTAGE CRITERIA FOR LVH, MAY BE NORMAL VARIANT BORDERLINE ECG NO PREVIOUS ECGS AVAILABLE    Past Medical History:  Diagnosis Date  . Cirrhosis (HCC) 2019   presented with decompensated cirrhosis  . PSC (primary sclerosing cholangitis)     Past Surgical History:  Procedure Laterality  Date  . arm surgery Right    age 48     Current Outpatient Medications  Medication Sig Dispense Refill  . furosemide (LASIX) 40 MG tablet Take 1 tablet (40 mg total) by mouth daily with breakfast. 30 tablet 2  . spironolactone (ALDACTONE) 100 MG tablet Take 1 tablet (100 mg total) by mouth daily with breakfast. 30 tablet 2   No current facility-administered medications for this visit.     Allergies as of 03/10/2018  . (No Known Allergies)    Family History  Problem Relation Age of Onset  . Hypertension Maternal Grandmother   . Diabetes Maternal Grandmother   . Cancer Maternal Grandmother   . Hypertension Maternal Aunt   . Diabetes Maternal Aunt   . Liver disease Neg Hx   . Colon cancer Neg Hx   . Colon polyps Neg Hx     Social History   Socioeconomic History  . Marital status: Single    Spouse name: Not on file  . Number of children: Not on file  . Years of education: Not on file  . Highest education level: Not on file  Occupational History  . 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 Financial resource strain: Not on file  . Food insecurity:    Worry: Not on file    Inability: Not on file  . Transportation needs:    Medical: Not on file    Non-medical: Not on file  Tobacco Use  . Smoking status: Never Smoker  . Smokeless tobacco: Never Used  Substance and Sexual Activity  . Alcohol use: Never    Frequency: Never  . Drug use: Never    Comment: Occasionally drinks tea. Drink a soda occasionally.  . Sexual activity: Not on file  Lifestyle  . Physical activity:    Days per week: Not on file    Minutes per session: Not on file  . Stress: Not on file  Relationships  . Social connections:    Talks on phone: Not on file    Gets together: Not on file    Attends religious service: Not on file    Active member of club or organization: Not on file    Attends meetings of clubs or organizations: Not on file    Relationship status: Not on file  . Intimate  partner violence:    Fear of current or ex partner: Not on file    Emotionally abused: Not on file    Physically abused: Not on file    Forced sexual activity: Not on file  Other Topics Concern  . Not on file  Social History Narrative  . Not on file    Review of Systems: General: Negative for anorexia, weight loss, fever, chills. Eyes: His eyes have been yellow.  ENT: Negative for hoarseness, difficulty swallowing. CV: Negative for chest pain, angina, palpitations, peripheral edema.  Respiratory: Negative for dyspnea at rest, cough, sputum, wheezing.  GI: See history of present illness. Derm: Itching improved.  Neuro: Negative for memory loss, confusion.  Endo: Negative for unusual weight change.  Heme: Negative for bruising or bleeding. Allergy: Negative for rash or hives.  Physical Exam: BP 122/71   Pulse (!) 141   Temp 97.9 F (36.6 C) (Oral)   Ht 6' (1.829 m)   Wt 184 lb 12.8 oz (83.8 kg)   BMI 25.06 kg/m  General:   Alert and oriented. Pleasant and cooperative. Well-nourished and well-developed.  Eyes:  Scleral icterus.  Ears:  Normal auditory acuity. Cardiovascular:  S1, S2 present without murmurs appreciated. Extremities without clubbing or edema. Respiratory:  Clear to auscultation bilaterally. No wheezes, rales, or rhonchi. No distress.  Gastrointestinal:  +BS, soft, non-tender and non-distended. No HSM noted. No guarding or rebound. No masses appreciated.  Rectal:  Deferred  Musculoskalatal:  Symmetrical without gross deformities.  Neurologic:  Alert and oriented x4;  grossly normal neurologically. Psych:  Alert and cooperative. Normal mood and affect. Heme/Lymph/Immune: No excessive bruising noted.    03/10/2018 3:04 PM   Disclaimer: This note was dictated with voice recognition software. Similar sounding words can inadvertently be transcribed and may not be corrected upon review.

## 2018-03-10 NOTE — Assessment & Plan Note (Signed)
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  Based on his hospitalization notes that I have reviewed, there may be some details to sure it.  We indicated he would likely need a colonoscopy and upper endoscopy as an outpatient.  [facility] notes indicate possible need for ERCP and possible EGD.  I am unsure if they are planning on completing his endoscopic evaluations or if there is any need for [place] to complete one or more of his procedures.  We will ReachOut to [facility] to discuss.  We will call the patient with recommendations next week.  In the meantime, I will plan for him to follow-up in 4 to 6 weeks to discuss final recommendations and decide the plan moving forward.  We will need to decide how much of his care we will be performing versus [facility], while he is undergoing evaluation and treatment leading up to potential transplant.

## 2018-03-10 NOTE — Assessment & Plan Note (Signed)
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  Patient was seen in the hospital for decompensated liver disease.  Jaundice then and now.  He has since been referred to [facility] for transplant evaluation.  He just finished his evaluation this week.  He states he was told that he is not sick enough for transplant yet.  They have completed a full evaluation and I do not see a contraindication to transplant.  His meld was 28 and they told him you typically have a transplant 1 year meld is between 8 and 40.  They have not changed his medications.  It is presumed they will assume his liver care.  Will discuss further with Dr. Jena Gauss.

## 2018-03-10 NOTE — Patient Instructions (Signed)
1. Continue your current medications. 2. We will reach out to [facility] and decide if they are performing your upper endoscopy and colonoscopy, or if they would like us to. 3. We will also decide if we need to do a liver biopsy or not.  If so, will it be at [facility] or here. 4. We will call you sometime next week with recommendations. 5. I will have you return for follow-up in 4 to 6 weeks. 6. Call us if you have any worsening symptoms, questions, or concerns.  At [facility] we value your feedback. You may receive a survey about your visit today. Please share your experience as we strive to create trusting relationships with our patients to provide genuine, compassionate, quality care.  It was great to meet you today!  Hope you have a great summer!!

## 2018-03-10 NOTE — Assessment & Plan Note (Signed)
Finished antibiotics.  Clinically improved.  Continue to monitor.  Follow-up with primary care.

## 2018-03-13 NOTE — Progress Notes (Signed)
No pcp per patient 

## 2018-03-18 ENCOUNTER — Emergency Department (HOSPITAL_COMMUNITY)
Admission: EM | Admit: 2018-03-18 | Discharge: 2018-03-18 | Disposition: A | Discharge status: Home / Self Care | Payer: PRIVATE HEALTH INSURANCE | Attending: Emergency Medicine | Admitting: Emergency Medicine

## 2018-03-18 ENCOUNTER — Encounter (HOSPITAL_COMMUNITY): Payer: Self-pay | Admitting: Emergency Medicine

## 2018-03-18 DIAGNOSIS — Y929 Unspecified place or not applicable: Secondary | ICD-10-CM | POA: Diagnosis not present

## 2018-03-18 DIAGNOSIS — Y999 Unspecified external cause status: Secondary | ICD-10-CM | POA: Diagnosis not present

## 2018-03-18 DIAGNOSIS — X58XXXA Exposure to other specified factors, initial encounter: Secondary | ICD-10-CM | POA: Diagnosis not present

## 2018-03-18 DIAGNOSIS — Z79899 Other long term (current) drug therapy: Secondary | ICD-10-CM | POA: Insufficient documentation

## 2018-03-18 DIAGNOSIS — K769 Liver disease, unspecified: Secondary | ICD-10-CM | POA: Insufficient documentation

## 2018-03-18 DIAGNOSIS — S0993XA Unspecified injury of face, initial encounter: Secondary | ICD-10-CM | POA: Diagnosis present

## 2018-03-18 DIAGNOSIS — D689 Coagulation defect, unspecified: Secondary | ICD-10-CM | POA: Diagnosis not present

## 2018-03-18 DIAGNOSIS — Y939 Activity, unspecified: Secondary | ICD-10-CM | POA: Insufficient documentation

## 2018-03-18 DIAGNOSIS — S0181XA Laceration without foreign body of other part of head, initial encounter: Secondary | ICD-10-CM | POA: Insufficient documentation

## 2018-03-18 LAB — CBC WITH DIFFERENTIAL/PLATELET
Basophils Absolute: 0.1 10*3/uL (ref 0.0–0.1)
Basophils Relative: 1 %
EOS ABS: 0.2 10*3/uL (ref 0.0–0.7)
Eosinophils Relative: 2 %
HCT: 28.4 % — ABNORMAL LOW (ref 39.0–52.0)
HEMOGLOBIN: 9.7 g/dL — AB (ref 13.0–17.0)
Lymphocytes Relative: 9 %
Lymphs Abs: 0.9 10*3/uL (ref 0.7–4.0)
MCH: 30.4 pg (ref 26.0–34.0)
MCHC: 34.2 g/dL (ref 30.0–36.0)
MCV: 89 fL (ref 78.0–100.0)
Monocytes Absolute: 0.8 10*3/uL (ref 0.1–1.0)
Monocytes Relative: 7 %
NEUTROS ABS: 8.6 10*3/uL — AB (ref 1.7–7.7)
NEUTROS PCT: 81 %
Platelets: 334 10*3/uL (ref 150–400)
RBC: 3.19 MIL/uL — AB (ref 4.22–5.81)
RDW: 17.4 % — ABNORMAL HIGH (ref 11.5–15.5)
WBC: 10.6 10*3/uL — AB (ref 4.0–10.5)

## 2018-03-18 LAB — BASIC METABOLIC PANEL
Anion gap: 7 (ref 5–15)
BUN: 8 mg/dL (ref 6–20)
CHLORIDE: 98 mmol/L — AB (ref 101–111)
CO2: 25 mmol/L (ref 22–32)
Calcium: 7.7 mg/dL — ABNORMAL LOW (ref 8.9–10.3)
Creatinine, Ser: 0.65 mg/dL (ref 0.61–1.24)
GFR calc non Af Amer: >60 mL/min (ref >60)
Glucose, Bld: 151 mg/dL — ABNORMAL HIGH (ref 65–99)
POTASSIUM: 3.3 mmol/L — AB (ref 3.5–5.1)
SODIUM: 130 mmol/L — AB (ref 135–145)

## 2018-03-18 LAB — PROTIME-INR
INR: 1.79
PROTHROMBIN TIME: 20.7 s — AB (ref 11.4–15.2)

## 2018-03-18 MED ORDER — LIDOCAINE-EPINEPHRINE (PF) 1 %-1:200000 IJ SOLN
10.0000 mL | Freq: Once | INTRAMUSCULAR | Status: AC
  Administered 2018-03-18: 10 mL via INTRADERMAL
  Filled 2018-03-18: qty 30

## 2018-03-18 NOTE — ED Provider Notes (Signed)
[facility] EMERGENCY DEPARTMENT Provider Note   CSN: [id number] Arrival date & time: 03/18/18  1030     History   Chief Complaint Chief Complaint  Patient presents with  . Wound Check    HPI Reginald Richardson is a 42 y.o. male.  Patient has a small bleeder on the left anterior chin area.   He has a known history of liver disease and is on the transplant list for a liver.  Subsequently, he has a clotting disorder.  He has tried pressure at home with minimal success.  Severity of symptoms is moderate.     Past Medical History:  Diagnosis Date  . Cirrhosis (HCC) 2019   presented with decompensated cirrhosis  . PSC (primary sclerosing cholangitis)     Patient Active Problem List   Diagnosis Date Noted  . PSC (primary sclerosing cholangitis)   . Hypoalbuminemia   . Abnormal liver function tests   . Protein-calorie malnutrition, severe 01/25/2018  . Ascites   . Cirrhosis of liver with ascites (HCC)   . Cellulitis of left foot 01/22/2018  . Anemia 01/22/2018  . Hyponatremia 01/22/2018  . Obstructive jaundice 01/22/2018  . 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 arm surgery Right    age 42         Home Medications    Prior to Admission medications   Medication Sig Start Date End Date Taking? Authorizing Provider  furosemide (LASIX) 40 MG tablet Take 1 tablet (40 mg total) by mouth daily with breakfast. 01/31/18  Yes Vassie LollMadera, Carlos, MD  spironolactone (ALDACTONE) 100 MG tablet Take 1 tablet (100 mg total) by mouth daily with breakfast. 01/31/18  Yes Vassie LollMadera, Carlos, MD    Family History Family History  Problem Relation Age of Onset  . Hypertension Maternal Grandmother   . Diabetes Maternal Grandmother   . Cancer Maternal Grandmother   . Hypertension Maternal Aunt   . Diabetes Maternal Aunt   . Liver disease Neg Hx   . Colon cancer Neg Hx   . Colon polyps Neg Hx     Social  History Social History   Tobacco Use  . Smoking status: Never Smoker  . Smokeless tobacco: Never Used  Substance Use Topics  . Alcohol use: Never    Frequency: Never  . Drug use: Never     Allergies   Patient has no known allergies.   Review of Systems Review of Systems  All other systems reviewed and are negative.    Physical Exam Updated Vital Signs BP 137/78 (BP Location: Right Arm)   Pulse 97   Temp 97.7 F (36.5 C) (Oral)   Resp 18   Ht 6' (1.829 m)   Wt 83.9 kg (185 lb)   SpO2 100%   BMI 25.09 kg/m   Physical Exam  Constitutional: He is oriented to person, place, and time. He appears well-developed and well-nourished.  HENT:  Head: Normocephalic and atraumatic.  Small punctate bleeding lesion on the left inferior chin  Eyes: Conjunctivae are normal.  Neck: Neck supple.  Musculoskeletal: Normal range of motion.  Neurological: He is alert and oriented to person, place, and time.  Skin: Skin is warm and dry.  Psychiatric: He has a normal mood and affect. His behavior is normal.  Nursing note and vitals reviewed.    ED Treatments / Results  Labs (all labs ordered are listed, but only abnormal results are displayed) Labs  Reviewed  CBC WITH DIFFERENTIAL/PLATELET - Abnormal; Notable for the following components:      Result Value   WBC 10.6 (*)    RBC 3.19 (*)    Hemoglobin 9.7 (*)    HCT 28.4 (*)    RDW 17.4 (*)    Neutro Abs 8.6 (*)    All other components within normal limits  BASIC METABOLIC PANEL - Abnormal; Notable for the following components:   Sodium 130 (*)    Potassium 3.3 (*)    Chloride 98 (*)    Glucose, Bld 151 (*)    Calcium 7.7 (*)    All other components within normal limits  PROTIME-INR - Abnormal; Notable for the following components:   Prothrombin Time 20.7 (*)    All other components within normal limits    EKG None  Radiology No results found.  Procedures .Marland KitchenLaceration Repair Date/Time: 03/18/2018 1:48  PM Performed by: Donnetta Hutching, MD Authorized by: Donnetta Hutching, MD   Consent:    Consent obtained:  Verbal   Consent given by:  Patient   Risks discussed:  Pain Anesthesia (see MAR for exact dosages):    Anesthesia method:  Local infiltration   Local anesthetic:  Lidocaine 1% WITH epi Laceration details:    Location: chin.   Length (cm):  0.2 Repair type:    Repair type:  Simple Pre-procedure details:    Preparation:  Patient was prepped and draped in usual sterile fashion Exploration:    Hemostasis achieved with:  Direct pressure   Contaminated: no   Treatment:    Area cleansed with:  Saline   Visualized foreign bodies/material removed: no   Skin repair:    Repair method:  Sutures   Suture size:  4-0   Suture material:  Prolene   Number of sutures: 1. Approximation:    Approximation:  Close Post-procedure details:    Patient tolerance of procedure:  Tolerated well, no immediate complications Comments:     Pursestring suture applied to bleeding lesion.  Good hemostasis.   (including critical care time)  Medications Ordered in ED Medications  lidocaine-EPINEPHrine (XYLOCAINE-EPINEPHrine) 1 %-1:200000 (PF) injection 10 mL (10 mLs Intradermal Given by Other 03/18/18 1312)     Initial Impression / Assessment and Plan / ED Course  I have reviewed the triage vital signs and the nursing notes.  Pertinent labs & imaging results that were available during my care of the patient were reviewed by me and considered in my medical decision making (see chart for details).     Patient presents with an uncontrollable bleeder on his left chin.  One pursestring suture of 4-0 Prolene was applied.  Good hemostasis was maintained.  Final Clinical Impressions(s) / ED Diagnoses   Final diagnoses:  Chin laceration, initial encounter    ED Discharge Orders    None       Donnetta Hutching, MD 03/18/18 1351

## 2018-03-18 NOTE — Discharge Instructions (Addendum)
Do not bend over or irritate the area.  Apply ice pack this afternoon.  Sutures out in 10 days.

## 2018-03-18 NOTE — ED Notes (Signed)
Bleeding controlled to chin at this time after stitch was applied and ice pack.

## 2018-03-18 NOTE — ED Triage Notes (Signed)
Pt's mother states pt has a bump on his face that started bleeding about an hour ago and will not stop.  Pt is awaiting liver transplant and has issues clotting.

## 2018-03-27 ENCOUNTER — Telehealth: Payer: Self-pay | Admitting: Nurse Practitioner

## 2018-03-27 NOTE — Telephone Encounter (Signed)
Unable to find contact info for [facility]. They indicated ? Need for ERCP and/or EGD.  We feel he needs TCS/EGD. Need to see if they will be doing these or if WE need to do these. I think a slot is blocked for him if needed.  Cc to AB who spoke previously with [facility] for follow-up.

## 2018-03-31 ENCOUNTER — Emergency Department (HOSPITAL_COMMUNITY): Payer: PRIVATE HEALTH INSURANCE

## 2018-03-31 ENCOUNTER — Encounter (HOSPITAL_COMMUNITY): Payer: Self-pay

## 2018-03-31 ENCOUNTER — Other Ambulatory Visit: Payer: Self-pay

## 2018-03-31 ENCOUNTER — Inpatient Hospital Stay (HOSPITAL_COMMUNITY)
Admission: EM | Admit: 2018-03-31 | Discharge: 2018-04-02 | DRG: 371 | Disposition: A | Discharge status: Other Acute Inpatient Hospital | Payer: PRIVATE HEALTH INSURANCE | Attending: Internal Medicine | Admitting: Internal Medicine

## 2018-03-31 DIAGNOSIS — R188 Other ascites: Secondary | ICD-10-CM | POA: Diagnosis present

## 2018-03-31 DIAGNOSIS — N179 Acute kidney failure, unspecified: Secondary | ICD-10-CM | POA: Diagnosis present

## 2018-03-31 DIAGNOSIS — Q453 Other congenital malformations of pancreas and pancreatic duct: Secondary | ICD-10-CM

## 2018-03-31 DIAGNOSIS — E871 Hypo-osmolality and hyponatremia: Secondary | ICD-10-CM | POA: Diagnosis present

## 2018-03-31 DIAGNOSIS — K652 Spontaneous bacterial peritonitis: Secondary | ICD-10-CM | POA: Diagnosis not present

## 2018-03-31 DIAGNOSIS — Y848 Other medical procedures as the cause of abnormal reaction of the patient, or of later complication, without mention of misadventure at the time of the procedure: Secondary | ICD-10-CM | POA: Diagnosis present

## 2018-03-31 DIAGNOSIS — K7460 Unspecified cirrhosis of liver: Secondary | ICD-10-CM | POA: Diagnosis present

## 2018-03-31 DIAGNOSIS — K8301 Primary sclerosing cholangitis: Secondary | ICD-10-CM

## 2018-03-31 DIAGNOSIS — E869 Volume depletion, unspecified: Secondary | ICD-10-CM | POA: Diagnosis present

## 2018-03-31 DIAGNOSIS — Z79899 Other long term (current) drug therapy: Secondary | ICD-10-CM

## 2018-03-31 DIAGNOSIS — E43 Unspecified severe protein-calorie malnutrition: Secondary | ICD-10-CM | POA: Diagnosis present

## 2018-03-31 DIAGNOSIS — K754 Autoimmune hepatitis: Secondary | ICD-10-CM | POA: Diagnosis present

## 2018-03-31 DIAGNOSIS — Z7682 Awaiting organ transplant status: Secondary | ICD-10-CM

## 2018-03-31 DIAGNOSIS — D684 Acquired coagulation factor deficiency: Secondary | ICD-10-CM | POA: Diagnosis present

## 2018-03-31 DIAGNOSIS — Z6827 Body mass index (BMI) 27.0-27.9, adult: Secondary | ICD-10-CM

## 2018-03-31 DIAGNOSIS — R748 Abnormal levels of other serum enzymes: Secondary | ICD-10-CM | POA: Diagnosis present

## 2018-03-31 LAB — CBC WITH DIFFERENTIAL/PLATELET
Basophils Absolute: 0 10*3/uL (ref 0.0–0.1)
Basophils Relative: 0 %
Eosinophils Absolute: 0.1 10*3/uL (ref 0.0–0.7)
Eosinophils Relative: 1 %
HEMATOCRIT: 25.1 % — AB (ref 39.0–52.0)
Hemoglobin: 8.9 g/dL — ABNORMAL LOW (ref 13.0–17.0)
LYMPHS PCT: 4 %
Lymphs Abs: 0.7 10*3/uL (ref 0.7–4.0)
MCH: 29.8 pg (ref 26.0–34.0)
MCHC: 35.5 g/dL (ref 30.0–36.0)
MCV: 83.9 fL (ref 78.0–100.0)
MONO ABS: 1.6 10*3/uL — AB (ref 0.1–1.0)
MONOS PCT: 8 %
NEUTROS ABS: 16.8 10*3/uL — AB (ref 1.7–7.7)
Neutrophils Relative %: 87 %
Platelets: 321 10*3/uL (ref 150–400)
RBC: 2.99 MIL/uL — ABNORMAL LOW (ref 4.22–5.81)
RDW: 16.3 % — AB (ref 11.5–15.5)
WBC: 19.2 10*3/uL — ABNORMAL HIGH (ref 4.0–10.5)

## 2018-03-31 LAB — GLUCOSE, PLEURAL OR PERITONEAL FLUID: Glucose, Fluid: 40 mg/dL

## 2018-03-31 LAB — PROTIME-INR
INR: 4.18 — AB
PROTHROMBIN TIME: 40 s — AB (ref 11.4–15.2)

## 2018-03-31 LAB — GRAM STAIN: Gram Stain: NONE SEEN

## 2018-03-31 LAB — BODY FLUID CELL COUNT WITH DIFFERENTIAL
Eos, Fluid: 0 %
Lymphs, Fluid: 5 %
Monocyte-Macrophage-Serous Fluid: 1 % — ABNORMAL LOW (ref 50–90)
Neutrophil Count, Fluid: 94 % — ABNORMAL HIGH (ref 0–25)
WBC FLUID: 32731 uL — AB (ref 0–1000)

## 2018-03-31 LAB — HEPATIC FUNCTION PANEL
ALBUMIN: 1.2 g/dL — AB (ref 3.5–5.0)
ALT: 92 U/L — ABNORMAL HIGH (ref 17–63)
AST: 225 U/L — AB (ref 15–41)
Alkaline Phosphatase: 422 U/L — ABNORMAL HIGH (ref 38–126)
Bilirubin, Direct: 8.3 mg/dL — ABNORMAL HIGH (ref 0.1–0.5)
Indirect Bilirubin: 6.1 mg/dL — ABNORMAL HIGH (ref 0.3–0.9)
TOTAL PROTEIN: 6.5 g/dL (ref 6.5–8.1)
Total Bilirubin: 14.4 mg/dL — ABNORMAL HIGH (ref 0.3–1.2)

## 2018-03-31 LAB — BASIC METABOLIC PANEL
ANION GAP: 7 (ref 5–15)
BUN: 25 mg/dL — ABNORMAL HIGH (ref 6–20)
CO2: 22 mmol/L (ref 22–32)
Calcium: 7.6 mg/dL — ABNORMAL LOW (ref 8.9–10.3)
Chloride: 95 mmol/L — ABNORMAL LOW (ref 101–111)
Creatinine, Ser: 1.3 mg/dL — ABNORMAL HIGH (ref 0.61–1.24)
Glucose, Bld: 71 mg/dL (ref 65–99)
POTASSIUM: 4.1 mmol/L (ref 3.5–5.1)
SODIUM: 124 mmol/L — AB (ref 135–145)

## 2018-03-31 LAB — PROTEIN, PLEURAL OR PERITONEAL FLUID

## 2018-03-31 LAB — LACTATE DEHYDROGENASE, PLEURAL OR PERITONEAL FLUID: LD FL: 206 U/L — AB (ref 3–23)

## 2018-03-31 LAB — LIPASE, BLOOD: Lipase: 151 U/L — ABNORMAL HIGH (ref 11–51)

## 2018-03-31 LAB — ALBUMIN, PLEURAL OR PERITONEAL FLUID

## 2018-03-31 MED ORDER — FUROSEMIDE 40 MG PO TABS
40.0000 mg | ORAL_TABLET | Freq: Every day | ORAL | Status: DC
  Administered 2018-04-01 – 2018-04-02 (×2): 40 mg via ORAL
  Filled 2018-03-31 (×3): qty 1

## 2018-03-31 MED ORDER — PIPERACILLIN-TAZOBACTAM 3.375 G IVPB
3.3750 g | Freq: Three times a day (TID) | INTRAVENOUS | Status: DC
  Administered 2018-03-31 – 2018-04-01 (×3): 3.375 g via INTRAVENOUS
  Filled 2018-03-31 (×3): qty 50

## 2018-03-31 MED ORDER — ALBUMIN HUMAN 25 % IV SOLN
INTRAVENOUS | Status: AC
  Filled 2018-03-31: qty 100

## 2018-03-31 MED ORDER — SODIUM CHLORIDE 0.9 % IV BOLUS
1000.0000 mL | Freq: Once | INTRAVENOUS | Status: AC
  Administered 2018-03-31: 1000 mL via INTRAVENOUS

## 2018-03-31 MED ORDER — ALBUMIN HUMAN 25 % IV SOLN
50.0000 g | Freq: Once | INTRAVENOUS | Status: AC
  Administered 2018-03-31: 50 g via INTRAVENOUS
  Filled 2018-03-31: qty 200

## 2018-03-31 MED ORDER — SODIUM CHLORIDE 0.9 % IV SOLN
2.0000 g | Freq: Once | INTRAVENOUS | Status: AC
  Administered 2018-03-31: 2 g via INTRAVENOUS
  Filled 2018-03-31: qty 20

## 2018-03-31 MED ORDER — PIPERACILLIN-TAZOBACTAM 3.375 G IVPB
3.3750 g | Freq: Once | INTRAVENOUS | Status: AC
  Administered 2018-03-31: 3.375 g via INTRAVENOUS
  Filled 2018-03-31: qty 50

## 2018-03-31 MED ORDER — SPIRONOLACTONE 25 MG PO TABS
100.0000 mg | ORAL_TABLET | Freq: Every day | ORAL | Status: DC
  Administered 2018-04-01: 100 mg via ORAL
  Filled 2018-03-31 (×3): qty 1

## 2018-03-31 MED ORDER — ALBUMIN HUMAN 25 % IV SOLN
25.0000 g | Freq: Once | INTRAVENOUS | Status: AC
  Administered 2018-03-31: 25 g via INTRAVENOUS
  Filled 2018-03-31: qty 100

## 2018-03-31 MED ORDER — ALBUMIN HUMAN 25 % IV SOLN
50.0000 g | Freq: Once | INTRAVENOUS | Status: DC
  Filled 2018-03-31: qty 200

## 2018-03-31 NOTE — Telephone Encounter (Signed)
FYI Eric:  [facility] been [facility]  However, he presented to the ED 6/21 with concern for SBP and [facility].   Colonoscopy will be needed at a later date once clinically appropriate. [facility]/18/[facility] had been [facility] as [facility] for colon[facility]. He will likely need to return to this office prior to arranging this due to decline in status.

## 2018-03-31 NOTE — Consult Note (Signed)
Referring Provider: Benjiman Core, MD Primary Care Physician:  Benita Stabile, MD Primary Gastroenterologist:  Roetta Sessions, MD  Reason for Consultation:  ?SBP, decompensated cirrhosis/PSC  HPI: Reginald Richardson is a 42 y.o. 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  He has been evaluated at the liver transplant center at [facility]  Meld has been as high as 29, patient not yet listed for transplant.  He underwent an ERCP on June 11 the bile duct could not be cannulated, biliary precut sphincterotomy a 4 mm performed, significant oozing occurred.  Ventral pancreatic duct was inadvertently cannulated, complete pancreas divisum was identified.  Plans for repeat ERCP for repeat attempt at cannulation, currently on the schedule for June 25.    MELD 28 on 03/08/18.  At that time his hemoglobin was 10.9, platelets 465,000, total bilirubin 16.3, alkaline phosphatase 776, AST 391, ALT 157, albumin 2.7.  Creatinine 0.53.  Sodium 133.  Patient presented to the emergency department early this morning with complaints of increasing abdominal distention associated discomfort, shortness of breath.  No fevers.  No vomiting, constipation, diarrhea, melena, rectal bleeding.  He has been able to eat but notes that he eats smaller amounts because of the abdominal distention.  He states he came to the emergency department because he was advised by the liver transplant center of possible signs and symptoms of decompensated cirrhosis/SBP etc.  In the ED he had abdominal paracentesis with removal of 1600 cc of amber-yellow cloudy fluid.  Fluid has been sent for analysis including cell count and culture.  I added on cytology as he is never had a previous tap.  They are awaiting possible ER to ER transfer to [facility] as patient is well established with oncology clinic/liver transplant  center and due for ERCP with stenting on Tuesday.  In the ED his vital signs been stable.  He is currently on Zosyn for possible SBP.  His weight is essentially the same as it was 3 weeks ago.  Continues to have lower extremity edema.  Denies itching.  Prior to Admission medications   Medication Sig Start Date End Date Taking? Authorizing Provider  furosemide (LASIX) 40 MG tablet Take 1 tablet (40 mg total) by mouth daily with breakfast. 01/31/18  Yes Vassie Loll, MD  spironolactone (ALDACTONE) 100 MG tablet Take 1 tablet (100 mg total) by mouth daily with breakfast. 01/31/18  Yes Vassie Loll, MD    Current Facility-Administered Medications  Medication Dose Route Frequency Provider Last Rate Last Dose  . [START ON 04/01/2018] furosemide (LASIX) tablet 40 mg  40 mg Oral Q breakfast Benjiman Core, MD      . piperacillin-tazobactam (ZOSYN) IVPB 3.375 g  3.375 g Intravenous Jethro Bastos, MD 12.5 mL/hr at 03/31/18 1350 3.375 g at 03/31/18 1350  . [START ON 04/01/2018] spironolactone (ALDACTONE) tablet 100 mg  100 mg Oral Q breakfast Benjiman Core, MD       Current Outpatient Medications  Medication Sig Dispense Refill  . furosemide (LASIX) 40 MG tablet Take 1 tablet (40 mg total) by mouth daily with breakfast. 30 tablet 2  . spironolactone (ALDACTONE) 100 MG tablet Take 1 tablet (100 mg total) by mouth daily with breakfast. 30 tablet 2    Allergies as of 03/31/2018  . (No Known Allergies)    Past Medical History:  Diagnosis Date  . Cirrhosis (HCC) 2019   presented with decompensated cirrhosis  . PSC (  primary sclerosing cholangitis)     Past Surgical History:  Procedure Laterality Date  . arm surgery Right    age 42     Family History  Problem Relation Age of Onset  . Hypertension Maternal Grandmother   . Diabetes Maternal Grandmother   . Cancer Maternal Grandmother   . Hypertension Maternal Aunt   . Diabetes Maternal Aunt   . Liver disease Neg Hx   . Colon  cancer Neg Hx   . Colon polyps Neg Hx     Social History   Socioeconomic History  . Marital status: Single    Spouse name: Not on file  . Number of children: Not on file  . Years of education: Not on file  . Highest education level: Not on file  Occupational History  . 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 Financial resource strain: Not on file  . Food insecurity:    Worry: Not on file    Inability: Not on file  . Transportation needs:    Medical: Not on file    Non-medical: Not on file  Tobacco Use  . Smoking status: Never Smoker  . Smokeless tobacco: Never Used  Substance and Sexual Activity  . Alcohol use: Never    Frequency: Never  . Drug use: Never  . Sexual activity: Not on file  Lifestyle  . Physical activity:    Days per week: Not on file    Minutes per session: Not on file  . Stress: Not on file  Relationships  . Social connections:    Talks on phone: Not on file    Gets together: Not on file    Attends religious service: Not on file    Active member of club or organization: Not on file    Attends meetings of clubs or organizations: Not on file    Relationship status: Not on file  . Intimate partner violence:    Fear of current or ex partner: Not on file    Emotionally abused: Not on file    Physically abused: Not on file    Forced sexual activity: Not on file  Other Topics Concern  . Not on file  Social History Narrative  . Not on file     ROS:  General: See HPI  eyes: Negative for vision changes.  Complains of yellowing of his eyes. ENT: Negative for hoarseness, difficulty swallowing , nasal congestion. CV: Negative for chest pain, angina, palpitations, dyspnea on exertion, peripheral edema.  Respiratory: Negative for dyspnea at rest, dyspnea on exertion, cough, sputum, wheezing.  GI: See history of present illness. GU:  Negative for dysuria, hematuria, urinary incontinence, urinary frequency, nocturnal urination.  Urine is dark  brown. MS: Negative for joint pain, low back pain.  Derm: Negative for rash or itching.  Neuro: Negative for weakness, abnormal sensation, seizure, frequent headaches, memory loss, confusion.  Psych: Negative for anxiety, depression, suicidal ideation, hallucinations.  Endo: Negative for unusual weight change.  Heme: Negative for bruising or bleeding. Allergy: Negative for rash or hives.       Physical Examination: Vital signs in last 24 hours: Temp:  [98.6 F (37 C)-98.7 F (37.1 C)] 98.7 F (37.1 C) (06/21 0649) Pulse Rate:  [85-101] 92 (06/21 1330) Resp:  [14-18] 16 (06/21 1330) BP: (109-124)/(60-78) 119/66 (06/21 1330) SpO2:  [98 %-100 %] 100 % (06/21 1330) Weight:  [185 lb (83.9 kg)] 185 lb (83.9 kg) (06/21 0101)    General: Chronically ill-appearing, slightly short of  breath with lying flat and speaking. Head: Normocephalic, atraumatic.   Eyes: Conjunctiva pink, deep icterus Mouth: Oropharyngeal mucosa moist and pink , no lesions erythema or exudate. Neck: Supple without thyromegaly, masses, or lymphadenopathy.  Lungs: Clear to auscultation bilaterally.  Heart: Regular rate and rhythm, no murmurs rubs or gallops.  Abdomen: Bowel sounds are normal, moderate diffuse tenderness without rebound.  Abdomen moderately distended status post recent tap.    Rectal: Performed Extremities: Chronically edematous bilaterally, 2-3+ pitting edema to the knees.  Patient denies scrotal edema.   Neuro: Alert and oriented x 4 , grossly normal neurologically.  Skin: Warm and dry, no rash or jaundice.   Psych: Alert and cooperative, normal mood and affect.        Intake/Output from previous day: 06/20 0701 - 06/21 0700 In: 150 [IV Piggyback:150] Out: -  Intake/Output this shift: Total I/O In: -  Out: 250 [Urine:250]  Lab Results: CBC Recent Labs    03/31/18 0207  WBC 19.2*  HGB 8.9*  HCT 25.1*  MCV 83.9  PLT 321   BMET Recent Labs    03/31/18 0207  NA 124*  K 4.1  CL  95*  CO2 22  GLUCOSE 71  BUN 25*  CREATININE 1.30*  CALCIUM 7.6*   LFT Recent Labs    03/31/18 0207  BILITOT 14.4*  BILIDIR 8.3*  IBILI 6.1*  ALKPHOS 422*  AST 225*  ALT 92*  PROT 6.5  ALBUMIN 1.2*    Lipase Recent Labs    03/31/18 0207  LIPASE 151*    PT/INR No results for input(s): LABPROT, INR in the last 72 hours.    Imaging Studies: Dg Chest 2 View  Result Date: 03/31/2018 CLINICAL DATA:  Shortness of breath. Worsening pain today and tonight. Awaiting for liver transplant. EXAM: CHEST - 2 VIEW COMPARISON:  01/07/2018 FINDINGS: Shallow inspiration with linear atelectasis in the lung bases, increasing since previous study. No focal consolidation. No blunting of costophrenic angles. No pneumothorax. Heart size and pulmonary vascularity are normal. IMPRESSION: Shallow inspiration with linear atelectasis in the lung bases, increasing since previous study. Electronically Signed   By: Burman Nieves M.D.   On: 03/31/2018 03:43  [4 week]   Impression: 42 year old gentleman with suspected PSC with cirrhosis, being followed by [facility] liver transplant center, who presents with worsening ascites/abdominal pain/shortness of breath. He is scheduled for ERCP on June 25 at [facility].  Failed ERCP for biliary stenting a couple weeks ago, bile duct could not be cannulated and with sphincterotomy he had significant oozing.  Pancreatic duct was inadvertently cannulated, pancreas divisum discovered.  Upon presentation he is noted to have leukocytosis, essentially his transaminases and alkaline phosphatase are improved but he has had decline in creatinine, sodium, albumin.  Current MELD unknown, pending INR.   Abdominal pain likely related to SBP, less likely related to post ERCP pancreatitis which was 11 days ago.  Lipase is minimally elevated.    Plan: 1. Determine MELD. 2. Follow-up pending fluid analysis. 3. Continue supportive measures. 4. Agree with transfer to [facility] as planned.  Discussed with Dr. Rubin Payor.   We would like to thank you for the opportunity to participate in the care of Reginald Richardson.  Leanna Battles. Dixon Boos [facility] [phone] 6/21/20192:11 PM     LOS: 0 days

## 2018-03-31 NOTE — Progress Notes (Signed)
Pharmacy Antibiotic Note  Reginald Richardson is a 42 y.o. male admitted on 03/31/2018 with intra-abdominal infection.  Pharmacy has been consulted for zosyn dosing.  Plan:  Continue with Zosyn 3.375g IV q8h (4 hour infusion). F/U cxs and clinical progress Monitor V/S, labs  Height: 6' (182.9 cm) Weight: 185 lb (83.9 kg) IBW/kg (Calculated) : 77.6  Temp (24hrs), Avg:98.7 F (37.1 C), Min:98.6 F (37 C), Max:98.7 F (37.1 C)  Recent Labs  Lab 03/31/18 0207  WBC 19.2*  CREATININE 1.30*    Estimated Creatinine Clearance: 82.9 mL/min (A) (by C-G formula based on SCr of 1.3 mg/dL (H)).    No Known Allergies  Antimicrobials this admission: Zosyn 6/21 >>   Dose adjustments this admission: n/a  Microbiology results: 6/21 BCx: pending 6/21 ascitic fluid: pending  01/24/2018 MRSA PCR: negative  Thank you for allowing pharmacy to be a part of this patient's care.  Elder CyphersLorie Louellen Haldeman, BS Loura Backharm D, [place] Clinical Pharmacist Pager [phone] 03/31/2018 1:38 PM

## 2018-03-31 NOTE — Progress Notes (Addendum)
Preliminary ascitic fluid cell count consistent with SBP.   WBC 32,731 with 94% neutrophils.   Continue antibiotic coverage. Will give additional albumin now. There has been evidence of giving 1.5 grams of albumin per kg of body weight within six hours of SBP diagnosis can improve outcome and reduce chances of renal failure. Patient's calculate dose would be 125 grams of albumin. Further recommendations of additional albumin on day 3 at rate of 1gram/kg.   Reginald BattlesLeslie S. Dixon BoosLewis, PA-C [facility] [phone] 6/21/20192:26 PM  Agree with above recommendations. [facility]/management.

## 2018-03-31 NOTE — ED Notes (Signed)
Band-Aid to right abdomen to lower by Dr Rubin PayorPickering following procedure.

## 2018-03-31 NOTE — ED Triage Notes (Signed)
Pt is waiting for a liver transplant, goes to [facility] for most of his care, states pain has been worsening today and tonight.  Pt denies n/v/d

## 2018-03-31 NOTE — ED Notes (Signed)
Per MD, Pt can have small snacks and fluids. Continuous NS to maintain stable BP @ 16500ml/hr. [facility] spoke to EDP earlier and still cannot accept Pt transfer stating their facility is at capacity.

## 2018-03-31 NOTE — ED Provider Notes (Signed)
[facility] EMERGENCY DEPARTMENT Provider Note   CSN: [id number] Arrival date & time: 03/31/18  0042     History   Chief Complaint Chief Complaint  Patient presents with  . Abdominal Pain    HPI Reginald Richardson is a 42 y.o. male.  Patient is a 42 year old male with past medical history of nonalcoholic cirrhosis/primary sclerosing cholangitis diagnosed in April of this year.  He is followed by his physicians at [facility].  He is awaiting transplant.  He presents today for evaluation of increased abdominal distention and abdominal pain that makes it difficult for him to breathe.  He denies any fevers or chills.  He denies any nausea, vomiting, diarrhea, or constipation.  The history is provided by the patient.  Abdominal Pain   This is a new problem. The current episode started 2 days ago. The problem occurs constantly. The problem has been rapidly worsening. The pain is associated with an unknown factor. The pain is located in the generalized abdominal region. The pain is moderate. Associated symptoms include nausea. Pertinent negatives include fever, hematochezia, constipation and dysuria. Exacerbated by: Movement and palpation. Nothing relieves the symptoms.    Past Medical History:  Diagnosis Date  . Cirrhosis (HCC) 2019   presented with decompensated cirrhosis  . PSC (primary sclerosing cholangitis)     Patient Active Problem List   Diagnosis Date Noted  . PSC (primary sclerosing cholangitis)   . Hypoalbuminemia   . Abnormal liver function tests   . Protein-calorie malnutrition, severe 01/25/2018  . Ascites   . Cirrhosis of liver with ascites (HCC)   . Cellulitis of left foot 01/22/2018  . Anemia 01/22/2018  . Hyponatremia 01/22/2018  . Obstructive jaundice 01/22/2018  . 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 arm surgery Right    age 70         Home Medications    Prior  to Admission medications   Medication Sig Start Date End Date Taking? Authorizing Provider  furosemide (LASIX) 40 MG tablet Take 1 tablet (40 mg total) by mouth daily with breakfast. 01/31/18   Vassie Loll, MD  spironolactone (ALDACTONE) 100 MG tablet Take 1 tablet (100 mg total) by mouth daily with breakfast. 01/31/18   Vassie Loll, MD    Family History Family History  Problem Relation Age of Onset  . Hypertension Maternal Grandmother   . Diabetes Maternal Grandmother   . Cancer Maternal Grandmother   . Hypertension Maternal Aunt   . Diabetes Maternal Aunt   . Liver disease Neg Hx   . Colon cancer Neg Hx   . Colon polyps Neg Hx     Social History Social History   Tobacco Use  . Smoking status: Never Smoker  . Smokeless tobacco: Never Used  Substance Use Topics  . Alcohol use: Never    Frequency: Never  . Drug use: Never     Allergies   Patient has no known allergies.   Review of Systems Review of Systems  Constitutional: Negative for fever.  Gastrointestinal: Positive for abdominal pain and nausea. Negative for constipation and hematochezia.  Genitourinary: Negative for dysuria.  All other systems reviewed and are negative.    Physical Exam Updated Vital Signs BP 110/60   Pulse (!) 101   Temp 98.6 F (37 C)   Resp 18   Ht 6' (1.829 m)   Wt 83.9 kg (185 lb)   SpO2 98%  BMI 25.09 kg/m   Physical Exam  Constitutional: He is oriented to person, place, and time. No distress.  Patient is a chronically ill-appearing 42 year old male.  HENT:  Head: Normocephalic and atraumatic.  Mouth/Throat: Oropharynx is clear and moist.  Eyes: Scleral icterus is present.  Neck: Normal range of motion. Neck supple.  Cardiovascular: Normal rate and regular rhythm. Exam reveals no friction rub.  No murmur heard. Pulmonary/Chest: Effort normal and breath sounds normal. No respiratory distress. He has no wheezes. He has no rales.  Abdominal: Soft. Bowel sounds are  normal. He exhibits fluid wave and ascites. He exhibits no distension. There is generalized tenderness. There is rigidity. There is no rebound and no guarding.  Musculoskeletal: Normal range of motion. He exhibits no edema.  Neurological: He is alert and oriented to person, place, and time. Coordination normal.  Skin: Skin is warm and dry. He is not diaphoretic.  Jaundice is present  Nursing note and vitals reviewed.    ED Treatments / Results  Labs (all labs ordered are listed, but only abnormal results are displayed) Labs Reviewed - No data to display  EKG None  Radiology No results found.  Procedures Procedures (including critical care time)  Medications Ordered in ED Medications - No data to display   Initial Impression / Assessment and Plan / ED Course  I have reviewed the triage vital signs and the nursing notes.  Pertinent labs & imaging results that were available during my care of the patient were reviewed by me and considered in my medical decision making (see chart for details).  Patient is a 42 year old male with history of cirrhosis/primary sclerosing cholangitis.  He presents today with abdominal discomfort and distention that is worsening over the past several days.  He is found to have a white count of 19,000 along with a mild increase in his renal function.  [facility]'s [facility].  [facility] however there are no available inpatient beds and there are ER is on diversion and cannot accept new patients.  He has advised treating presumptively for SBP with Zosyn.  He is also suggested albumin which has been ordered.  Blood cultures obtained.  Patient [facility] in [facility] emergency department [facility]  Hopefully this will occur this morning.  Care will be signed out to the oncoming provider at shift change.  Final Clinical Impressions(s) / ED Diagnoses   Final  diagnoses:  None    ED Discharge Orders    None       Geoffery Lyonselo, Voula Waln, MD 03/31/18 [phone]

## 2018-03-31 NOTE — ED Notes (Signed)
CRITICAL VALUE ALERT  Critical Value:  INR 4.8  Date & Time Notied:  03/31/2018 @ 1426  Provider Notified: Dr Dorris CarnesN. Pickering  Orders Received/Actions taken: see new orders.

## 2018-03-31 NOTE — ED Notes (Signed)
[facility]  Still waiting discharges and will let us know as soon as bed is available.

## 2018-03-31 NOTE — Progress Notes (Signed)
INR 4.8.  MELD 37, decline from MELD 28 four weeks ago.   [facility].   Leanna BattlesLeslie S. Dixon BoosLewis, PA-C [facility] [phone] 6/21/20193:07 PM

## 2018-03-31 NOTE — ED Notes (Signed)
Placed on regular hospital bed.

## 2018-03-31 NOTE — ED Notes (Signed)
Tolerated paracentesis well.

## 2018-03-31 NOTE — Progress Notes (Signed)
Pharmacy Antibiotic Note  Reginald Richardson is a 42 y.o. male admitted on 03/31/2018 with possible SBP.  Pharmacy has been consulted for Zosyn. dosing.  Plan: Zosyn 3.375g IV q8h (4 hour infusion).  Height: 6' (182.9 cm) Weight: 185 lb (83.9 kg) IBW/kg (Calculated) : 77.6  Temp (24hrs), Avg:98.7 F (37.1 C), Min:98.6 F (37 C), Max:98.7 F (37.1 C)  Recent Labs  Lab 03/31/18 0207  WBC 19.2*  CREATININE 1.30*    Estimated Creatinine Clearance: 82.9 mL/min (A) (by C-G formula based on SCr of 1.3 mg/dL (H)).    No Known Allergies  Antimicrobials this admission: 6/21 Zosyn >>    Dose adjustments this admission: n/a  Microbiology results: 6/21 BC x2:  ng <12 hrs    Thank you for allowing pharmacy to be a part of this patient's care.  Tama Highamara Kataleena Holsapple 03/31/2018 11:31 AM

## 2018-03-31 NOTE — ED Provider Notes (Signed)
  Physical Exam  BP 115/75   Pulse 86   Temp 98.7 F (37.1 C)   Resp 16   Ht 6' (1.829 m)   Wt 83.9 kg (185 lb)   SpO2 100%   BMI 25.09 kg/m   Physical Exam  ED Course/Procedures     .Paracentesis Date/Time: 03/31/2018 1:44 PM Performed by: Benjiman CorePickering, Eltha Tingley, MD Authorized by: Benjiman CorePickering, Kaidan Harpster, MD   Consent:    Consent obtained:  Verbal   Consent given by:  Patient   Risks discussed:  Bleeding, bowel perforation, infection and pain   Alternatives discussed:  No treatment, delayed treatment and alternative treatment Pre-procedure details:    Procedure purpose:  Diagnostic Anesthesia (see MAR for exact dosages):    Anesthesia method:  Local infiltration   Local anesthetic:  Lidocaine 1% w/o epi Procedure details:    Ultrasound guidance: yes     Puncture site:  L lower quadrant   Fluid removed amount:  1600   Fluid appearance:  Amber, yellow and cloudy   Dressing:  Adhesive bandage Post-procedure details:    Patient tolerance of procedure:  Tolerated well, no immediate complications    MDM  Received patient in signout.  [facility] available at this time.  Have rechecked and still on the waiting list.  Likely SBP and has known sclerosing cholangitis.  Paracentesis done after discussion with GI, who later came and wrote a note        Benjiman CorePickering, Camdon Saetern, MD 03/31/18 1345

## 2018-03-31 NOTE — ED Notes (Signed)
Pt provided meal tray

## 2018-04-01 ENCOUNTER — Other Ambulatory Visit: Payer: Self-pay

## 2018-04-01 DIAGNOSIS — D684 Acquired coagulation factor deficiency: Secondary | ICD-10-CM

## 2018-04-01 DIAGNOSIS — R188 Other ascites: Secondary | ICD-10-CM | POA: Diagnosis present

## 2018-04-01 DIAGNOSIS — K7460 Unspecified cirrhosis of liver: Secondary | ICD-10-CM

## 2018-04-01 DIAGNOSIS — N179 Acute kidney failure, unspecified: Secondary | ICD-10-CM | POA: Diagnosis present

## 2018-04-01 DIAGNOSIS — K8301 Primary sclerosing cholangitis: Secondary | ICD-10-CM | POA: Diagnosis present

## 2018-04-01 DIAGNOSIS — E43 Unspecified severe protein-calorie malnutrition: Secondary | ICD-10-CM

## 2018-04-01 DIAGNOSIS — E871 Hypo-osmolality and hyponatremia: Secondary | ICD-10-CM

## 2018-04-01 DIAGNOSIS — R748 Abnormal levels of other serum enzymes: Secondary | ICD-10-CM | POA: Diagnosis present

## 2018-04-01 DIAGNOSIS — K652 Spontaneous bacterial peritonitis: Secondary | ICD-10-CM | POA: Diagnosis present

## 2018-04-01 DIAGNOSIS — Z79899 Other long term (current) drug therapy: Secondary | ICD-10-CM | POA: Diagnosis not present

## 2018-04-01 DIAGNOSIS — Y848 Other medical procedures as the cause of abnormal reaction of the patient, or of later complication, without mention of misadventure at the time of the procedure: Secondary | ICD-10-CM | POA: Diagnosis present

## 2018-04-01 DIAGNOSIS — Q453 Other congenital malformations of pancreas and pancreatic duct: Secondary | ICD-10-CM | POA: Diagnosis not present

## 2018-04-01 DIAGNOSIS — E869 Volume depletion, unspecified: Secondary | ICD-10-CM | POA: Diagnosis present

## 2018-04-01 DIAGNOSIS — Z6827 Body mass index (BMI) 27.0-27.9, adult: Secondary | ICD-10-CM | POA: Diagnosis not present

## 2018-04-01 DIAGNOSIS — K754 Autoimmune hepatitis: Secondary | ICD-10-CM | POA: Diagnosis present

## 2018-04-01 DIAGNOSIS — Z7682 Awaiting organ transplant status: Secondary | ICD-10-CM | POA: Diagnosis not present

## 2018-04-01 LAB — COMPREHENSIVE METABOLIC PANEL
ALBUMIN: 1.7 g/dL — AB (ref 3.5–5.0)
ALK PHOS: 433 U/L — AB (ref 38–126)
ALT: 93 U/L — ABNORMAL HIGH (ref 17–63)
AST: 238 U/L — AB (ref 15–41)
Anion gap: 11 (ref 5–15)
BILIRUBIN TOTAL: 16.8 mg/dL — AB (ref 0.3–1.2)
BUN: 30 mg/dL — AB (ref 6–20)
CALCIUM: 7.7 mg/dL — AB (ref 8.9–10.3)
CO2: 20 mmol/L — AB (ref 22–32)
Chloride: 95 mmol/L — ABNORMAL LOW (ref 101–111)
Creatinine, Ser: 1 mg/dL (ref 0.61–1.24)
GFR calc Af Amer: >60 mL/min (ref >60)
GFR calc non Af Amer: >60 mL/min (ref >60)
GLUCOSE: 106 mg/dL — AB (ref 65–99)
POTASSIUM: 4.5 mmol/L (ref 3.5–5.1)
SODIUM: 126 mmol/L — AB (ref 135–145)
Total Protein: 6.8 g/dL (ref 6.5–8.1)

## 2018-04-01 LAB — CBC WITH DIFFERENTIAL/PLATELET
Basophils Absolute: 0 10*3/uL (ref 0.0–0.1)
Basophils Relative: 0 %
Eosinophils Absolute: 0.1 10*3/uL (ref 0.0–0.7)
Eosinophils Relative: 0 %
HEMATOCRIT: 26.8 % — AB (ref 39.0–52.0)
HEMOGLOBIN: 9.5 g/dL — AB (ref 13.0–17.0)
LYMPHS ABS: 1 10*3/uL (ref 0.7–4.0)
LYMPHS PCT: 4 %
MCH: 30.4 pg (ref 26.0–34.0)
MCHC: 35.4 g/dL (ref 30.0–36.0)
MCV: 85.6 fL (ref 78.0–100.0)
Monocytes Absolute: 1.7 10*3/uL — ABNORMAL HIGH (ref 0.1–1.0)
Monocytes Relative: 6 %
NEUTROS ABS: 24 10*3/uL — AB (ref 1.7–7.7)
NEUTROS PCT: 90 %
Platelets: 400 10*3/uL (ref 150–400)
RBC: 3.13 MIL/uL — ABNORMAL LOW (ref 4.22–5.81)
RDW: 16.3 % — ABNORMAL HIGH (ref 11.5–15.5)
WBC: 26.7 10*3/uL — ABNORMAL HIGH (ref 4.0–10.5)

## 2018-04-01 LAB — PROTIME-INR
INR: 4.23
Prothrombin Time: 40.4 seconds — ABNORMAL HIGH (ref 11.4–15.2)

## 2018-04-01 MED ORDER — SPIRONOLACTONE 25 MG PO TABS
100.0000 mg | ORAL_TABLET | Freq: Every day | ORAL | Status: DC
  Administered 2018-04-02: 100 mg via ORAL
  Filled 2018-04-01: qty 4

## 2018-04-01 MED ORDER — ONDANSETRON HCL 4 MG/2ML IJ SOLN: 4.0000 mg | Freq: Four times a day (QID) | INTRAMUSCULAR | Status: DC | PRN

## 2018-04-01 MED ORDER — SODIUM CHLORIDE 0.9 % IV SOLN
2.0000 g | INTRAVENOUS | Status: DC
  Administered 2018-04-01: 2 g via INTRAVENOUS
  Filled 2018-04-01: qty 20
  Filled 2018-04-01: qty 2

## 2018-04-01 MED ORDER — SODIUM CHLORIDE 0.9 % IV SOLN
INTRAVENOUS | Status: DC | PRN
  Administered 2018-04-01: 15:00:00 via INTRAVENOUS

## 2018-04-01 MED ORDER — HEPARIN SODIUM (PORCINE) 5000 UNIT/ML IJ SOLN: 5000.0000 [IU] | Freq: Three times a day (TID) | INTRAMUSCULAR | Status: DC

## 2018-04-01 MED ORDER — SODIUM CHLORIDE 0.9 % IV SOLN: 2.0000 g | INTRAVENOUS | Status: DC

## 2018-04-01 MED ORDER — SODIUM CHLORIDE 0.9 % IV SOLN
INTRAVENOUS | Status: DC
  Administered 2018-04-01: 08:00:00 via INTRAVENOUS

## 2018-04-01 MED ORDER — ONDANSETRON HCL 4 MG PO TABS: 4.0000 mg | ORAL_TABLET | Freq: Four times a day (QID) | ORAL | Status: DC | PRN

## 2018-04-01 MED ORDER — FENTANYL CITRATE (PF) 100 MCG/2ML IJ SOLN
25.0000 ug | INTRAMUSCULAR | Status: DC | PRN
  Administered 2018-04-02: 25 ug via INTRAVENOUS
  Filled 2018-04-01: qty 2

## 2018-04-01 MED ORDER — PHYTONADIONE 5 MG PO TABS
10.0000 mg | ORAL_TABLET | Freq: Once | ORAL | Status: AC
  Administered 2018-04-01: 10 mg via ORAL
  Filled 2018-04-01: qty 2

## 2018-04-01 MED ORDER — PIPERACILLIN-TAZOBACTAM 3.375 G IVPB
3.3750 g | Freq: Three times a day (TID) | INTRAVENOUS | Status: DC
Start: 2018-04-01 — End: 2018-04-02
  Administered 2018-04-01 – 2018-04-02 (×3): 3.375 g via INTRAVENOUS
  Filled 2018-04-01 (×3): qty 50

## 2018-04-01 NOTE — Progress Notes (Signed)
CRITICAL VALUE ALERT  Critical Value:  INR 4.23  Date & Time Notied:  04/01/2018 1335 Provider Notified: Dr. Arbutus Leasat  Orders Received/Actions taken: no new orders received

## 2018-04-01 NOTE — ED Provider Notes (Signed)
[facility] ED [facility].  He has SBP.  He was initially started on Zosyn however after Dr. [name], gastroenterologist saw him in the ED he was placed on Rocephin 2 g IV.  I did make the Rocephin daily.  Patient is currently doing a sponge bath.  He states he still has some abdominal discomfort.  His only temperature was when he first arrived in the ED.  Orders were changed to get a temperature done every shift.  Dr. Clayborne DanaMesner started him on normal saline at 100 cc/h after which his blood pressure improved.   Reginald Richardson, Reginald Bartko, MD 04/01/18 [phone]

## 2018-04-01 NOTE — ED Provider Notes (Signed)
[facility].  Patient has spontaneous bacterial peritonitis.  [facility] does not have any beds to transfer the patient to.  He has been  waiting in the emergency department for over 30 hours to be transferred.  [facility] will not accept the patient to their emergency department either.  I spoke with the gastroenterologist Dr. [name], he stated it is fine if the patient is admitted to the hospital here and he will continue his care of the patient   Bethann BerkshireZammit, Raea Magallon, MD 04/01/18 [phone]

## 2018-04-01 NOTE — Progress Notes (Signed)
Discussed with Dr. Arbutus Leasat.  [facility] Needs ceftriaxone 2 g IV daily times at least 5 days (in lieu of Zosyn). Would consider a repeat paracentesis on 6/24 with additional dose of IV albumin if he is still here. Cultures pending.

## 2018-04-01 NOTE — H&P (Signed)
History and Physical  Reginald Richardson ZOX:[id number] DOB: 12/26/1975 DOA: 03/31/2018   PCP: [name], John Z, MD   Patient coming from: Home  Chief Complaint: abdominal pain and distension  HPI:  Reginald Richardson is a 42 y.o. 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  The patient was initially admitted to the hospital from 01/22/2018 through 01/30/2018 for decompensated cirrhosis.  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  Since then, the patient has been evaluated at the liver transplant center at [facility].  He has not yet been listed for transplant.  He underwent ERCP on 03/21/2018.  Apparently, the common bile duct could not be cannulated.  Sphincterotomy was performed with significant oozing. Ventral pancreatic duct was inadvertently cannulated, complete pancreas divisum was identified.  Plans for repeat ERCP for repeat attempt at cannulation, currently on the schedule for June 25.   MELD 28 on 03/08/18 The patient denied any fevers, chills, chest pain, vomiting, diarrhea.  He complained of abdominal pain without hematochezia or melena.  He has some shortness of breath as result of his abdominal distention.  In the emergency department, the patient underwent paracentesis with removal of 1600 cc of fluid.  The fluid was sent for analysis and revealed WBC 32,731 with 94% neutrophils.  The patient was started on Zosyn for treatment of spontaneous bacterial peritonitis.  GI was consulted and recommended transfer [facility] for further evaluation and care.  The patient's gastroenterologist, Dr. Julieta GuttingHayashi, was contacted, and accepted the patient, but there has been no beds available at [facility].  EDP, Dr. Estell HarpinZammit subsequently discussed with GI, Dr. [name] who felt the patient could be admitted to the medical floor  while waiting for transfer.   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 04/01/2018--the patient remains on the waiting list for transfer -Continue furosemide and spironolactone -May need repeat paracentesis--defer to GI  AKI -due to volume depletion -repeat BMP after albumin and IVF  Hyponatremia -Secondary to liver cirrhosis and volume depletion and AKI with impaired renal Na excretion -Repeat BMP, monitor clinically  Severe protein calorie malnutrition -Start supplementation if patient able to tolerate  Coagulopathy -Secondary to liver cirrhosis -INR 4.18 -give vitamin K        Past Medical History:  Diagnosis Date  . Cirrhosis (HCC) 2019   presented with decompensated cirrhosis  . PSC (primary sclerosing cholangitis)    Past Surgical History:  Procedure Laterality Date  . arm surgery Right    age 42    Social History:  reports that he has never smoked. He has never used smokeless tobacco. He reports that he does not drink alcohol or use drugs.   Family History  Problem Relation Age of Onset  . Hypertension Maternal Grandmother   . Diabetes Maternal Grandmother   . Cancer Maternal Grandmother   . Hypertension Maternal Aunt   . Diabetes Maternal Aunt   . Liver disease Neg Hx   . Colon cancer Neg Hx   . Colon polyps Neg Hx      No Known Allergies   Prior to Admission  medications   Medication Sig Start Date End Date Taking? Authorizing Provider  furosemide (LASIX) 40 MG tablet Take 1 tablet (40 mg total) by mouth daily with breakfast. 01/31/18  Yes Vassie Loll, MD  spironolactone (ALDACTONE) 100 MG tablet  Take 1 tablet (100 mg total) by mouth daily with breakfast. 01/31/18  Yes Vassie Loll, MD    Review of Systems:  Constitutional:  No weight loss, night sweats, Fevers, chills Head&Eyes: No headache.  No vision loss.  No eye pain or scotoma ENT:  No Difficulty swallowing,Tooth/dental problems,Sore throat,  No ear ache, post nasal drip,  Cardio-vascular:  No chest pain, Orthopnea, PND, swelling in lower extremities,  dizziness, palpitations  GI:  No  vomiting, diarrhea, loss of appetite, hematochezia, melena, heartburn, indigestion, Resp:  No shortness of breath with exertion or at rest. No cough. No coughing up of blood .No wheezing.No chest wall deformity  Skin:  no rash or lesions.  GU:  no dysuria, change in color of urine, no urgency or frequency. No flank pain.  Musculoskeletal:  No joint pain or swelling. No decreased range of motion. No back pain.  Psych:  No change in mood or affect. No depression or anxiety. Neurologic: No headache, no dysesthesia, no focal weakness, no vision loss. No syncope  Physical Exam: Vitals:   04/01/18 0400 04/01/18 0546 04/01/18 0547 04/01/18 0700  BP: 116/61 (!) 112/55 (!) 112/55 (!) 110/58  Pulse: 99 96 94 99  Resp: (!) 26  (!) 23 (!) 22  Temp:   97.6 F (36.4 C)   TempSrc:   Oral   SpO2: 97% 96% 96% 100%  Weight:      Height:       General:  A&O x 3, NAD, nontoxic, pleasant/cooperative Head/Eye: No conjunctival hemorrhage, no icterus, [place]/AT, No nystagmus ENT: + icterus,  No thrush, good dentition, no pharyngeal exudate Neck:  No masses, no lymphadenpathy, no bruits CV:  RRR, no rub, no gallop, no S3 Lung:  Bibasilar crackles, no wheeze Abdomen: soft/diffusely tender, +BS, nondistended, no peritoneal signs Ext: No cyanosis, No rashes, No petechiae, No lymphangitis, 2+ LE edema Neuro: CNII-XII intact, strength 4/5 in bilateral upper and lower extremities, no dysmetria  Labs on Admission:  Basic Metabolic Panel: Recent Labs    Lab 03/31/18 0207  NA 124*  K 4.1  CL 95*  CO2 22  GLUCOSE 71  BUN 25*  CREATININE 1.30*  CALCIUM 7.6*   Liver Function Tests: Recent Labs  Lab 03/31/18 0207  AST 225*  ALT 92*  ALKPHOS 422*  BILITOT 14.4*  PROT 6.5  ALBUMIN 1.2*   Recent Labs  Lab 03/31/18 0207  LIPASE 151*   No results for input(s): AMMONIA in the last 168 hours. CBC: Recent Labs  Lab 03/31/18 0207  WBC 19.2*  NEUTROABS 16.8*  HGB 8.9*  HCT 25.1*  MCV 83.9  PLT 321   Coagulation Profile: Recent Labs  Lab 03/31/18 1329  INR 4.18*   Cardiac Enzymes: No results for input(s): CKTOTAL, CKMB, CKMBINDEX, TROPONINI in the last 168 hours. BNP: Invalid input(s): POCBNP CBG: No results for input(s): GLUCAP in the last 168 hours. Urine analysis:    Component Value Date/Time   COLORURINE AMBER (A) 01/22/2018 2300   APPEARANCEUR HAZY (A) 01/22/2018 2300   LABSPEC 1.021 01/22/2018 2300   PHURINE 6.0 01/22/2018 2300   GLUCOSEU NEGATIVE 01/22/2018 2300   HGBUR MODERATE (A) 01/22/2018 2300   BILIRUBINUR MODERATE (A) 01/22/2018 2300   KETONESUR NEGATIVE 01/22/2018 2300  PROTEINUR NEGATIVE 01/22/2018 2300   NITRITE NEGATIVE 01/22/2018 2300   LEUKOCYTESUR NEGATIVE 01/22/2018 2300   Sepsis Labs: @LABRCNTIP (procalcitonin:4,lacticidven:4) ) Recent Results (from the past 240 hour(s))  Blood culture (routine x 2)     Status: None (Preliminary result)   Collection Time: 03/31/18  4:00 AM  Result Value Ref Range Status   Specimen Description BLOOD RIGHT HAND  Final   Special Requests   Final    BOTTLES DRAWN AEROBIC AND ANAEROBIC Blood Culture adequate volume   Culture   Final    NO GROWTH 1 DAY Performed at [facility], [address], [place], [place] [address]    Report Status PENDING  Incomplete  Blood culture (routine x 2)     Status: None (Preliminary result)   Collection Time: 03/31/18  4:10 AM  Result Value Ref Range Status   Specimen Description BLOOD LEFT HAND  Final   Special  Requests   Final    BOTTLES DRAWN AEROBIC AND ANAEROBIC Blood Culture results may not be optimal due to an inadequate volume of blood received in culture bottles   Culture   Final    NO GROWTH 1 DAY Performed at [facility], [address], [place], [place] [address]    Report Status PENDING  Incomplete  Culture, body fluid-bottle     Status: None (Preliminary result)   Collection Time: 03/31/18 12:57 PM  Result Value Ref Range Status   Specimen Description ASCITIC  Final   Special Requests BOTTLES DRAWN AEROBIC AND ANAEROBIC [facility] EACH  Final   Culture   Final    NO GROWTH < 24 HOURS Performed at [facility], [address] [place], [place] [address]    Report Status PENDING  Incomplete  Gram stain     Status: None   Collection Time: 03/31/18 12:57 PM  Result Value Ref Range Status   Specimen Description ASCITIC  Final   Special Requests NONE  Final   Gram Stain   Final    NO ORGANISMS SEEN CYTOSPIN SMEAR WBC PRESENT,BOTH PMN AND MONONUCLEAR Performed at [facility], [address] [place], [place] [address]    Report Status 03/31/2018 FINAL  Final     Radiological Exams on Admission: Dg Chest 2 View  Result Date: 03/31/2018 CLINICAL DATA:  Shortness of breath. Worsening pain today and tonight. Awaiting for liver transplant. EXAM: CHEST - 2 VIEW COMPARISON:  01/07/2018 FINDINGS: Shallow inspiration with linear atelectasis in the lung bases, increasing since previous study. No focal consolidation. No blunting of costophrenic angles. No pneumothorax. Heart size and pulmonary vascularity are normal. IMPRESSION: Shallow inspiration with linear atelectasis in the lung bases, increasing since previous study. Electronically Signed   By: Burman Nieves M.D.   On: 03/31/2018 03:43        Time spent:60 minutes Code Status:   FULL Family Communication:  No Family at bedside Disposition Plan: expect 2-3 day hospitalization Consults called: GI--[place] DVT Prophylaxis: [place] Heparin    Catarina Hartshorn, DO  [facility] Pager [phone]  If 7PM-7AM, please contact night-coverage [email or web address] Password [facility] 04/01/2018, 9:01 AM

## 2018-04-01 NOTE — Progress Notes (Signed)
CRITICAL VALUE ALERT  Critical Value:  Gram Negative Rods in anaerobic bottle of fluid culture from paracentesis   Date & Time Notied:  04/01/2018 1410  Provider Notified: Dr. Arbutus Leasat   Orders Received/Actions taken: no new orders received

## 2018-04-02 LAB — BASIC METABOLIC PANEL
Anion gap: 9 (ref 5–15)
BUN: 31 mg/dL — AB (ref 6–20)
CHLORIDE: 96 mmol/L — AB (ref 101–111)
CO2: 23 mmol/L (ref 22–32)
CREATININE: 1.01 mg/dL (ref 0.61–1.24)
Calcium: 7.8 mg/dL — ABNORMAL LOW (ref 8.9–10.3)
GFR calc Af Amer: >60 mL/min (ref >60)
Glucose, Bld: 95 mg/dL (ref 65–99)
Potassium: 4.6 mmol/L (ref 3.5–5.1)
SODIUM: 128 mmol/L — AB (ref 135–145)

## 2018-04-02 LAB — CBC
HCT: 27.1 % — ABNORMAL LOW (ref 39.0–52.0)
Hemoglobin: 9.6 g/dL — ABNORMAL LOW (ref 13.0–17.0)
MCH: 30 pg (ref 26.0–34.0)
MCHC: 35.4 g/dL (ref 30.0–36.0)
MCV: 84.7 fL (ref 78.0–100.0)
PLATELETS: 385 10*3/uL (ref 150–400)
RBC: 3.2 MIL/uL — ABNORMAL LOW (ref 4.22–5.81)
RDW: 16.3 % — AB (ref 11.5–15.5)
WBC: 24.6 10*3/uL — AB (ref 4.0–10.5)

## 2018-04-02 MED ORDER — GENERIC EXTERNAL MEDICATION
2.00 | Status: DC
Start: 2018-04-02 — End: 2018-04-02

## 2018-04-02 MED ORDER — ACETAMINOPHEN 500 MG PO TABS
500.00 | ORAL_TABLET | ORAL | Status: DC
Start: ? — End: 2018-04-02

## 2018-04-02 MED ORDER — OXYCODONE HCL 5 MG PO TABS
5.00 | ORAL_TABLET | ORAL | Status: DC
Start: ? — End: 2018-04-02

## 2018-04-02 MED ORDER — PIPERACILLIN-TAZOBACTAM 3.375 G IVPB: 3.3750 g | Freq: Three times a day (TID) | INTRAVENOUS | 0 refills | Status: AC

## 2018-04-02 MED ORDER — POLYETHYLENE GLYCOL 3350 17 G PO PACK
17.0000 g | PACK | Freq: Every day | ORAL | Status: DC
  Administered 2018-04-02: 17 g via ORAL
  Filled 2018-04-02: qty 1

## 2018-04-02 MED ORDER — POLYETHYLENE GLYCOL 3350 17 G PO PACK
17.00 | PACK | ORAL | Status: DC
Start: 2018-04-02 — End: 2018-04-02

## 2018-04-02 MED ORDER — HEPARIN SODIUM (PORCINE) 5000 UNIT/ML IJ SOLN
5000.00 | INTRAMUSCULAR | Status: DC
Start: 2018-04-02 — End: 2018-04-02

## 2018-04-02 MED ORDER — SENNA 8.6 MG PO TABS
2.0000 | ORAL_TABLET | Freq: Every day | ORAL | Status: DC
  Administered 2018-04-02: 17.2 mg via ORAL
  Filled 2018-04-02: qty 2

## 2018-04-02 MED ORDER — PIPERACILLIN-TAZOBACTAM 3.375 G IVPB
3.38 | INTRAVENOUS | Status: DC
Start: 2018-04-02 — End: 2018-04-02

## 2018-04-02 MED ORDER — GENERIC EXTERNAL MEDICATION
10.00 | Status: DC
Start: 2018-04-03 — End: 2018-04-02

## 2018-04-02 MED ORDER — PHYTONADIONE 5 MG PO TABS
10.0000 mg | ORAL_TABLET | Freq: Once | ORAL | Status: AC
  Administered 2018-04-02: 10 mg via ORAL
  Filled 2018-04-02: qty 2

## 2018-04-02 NOTE — Progress Notes (Signed)
[facility] ground transport arrived to unit to [facility].  Report given to paramedic.

## 2018-04-02 NOTE — Discharge Summary (Signed)
Physician Discharge Summary  Reginald BushyShaun Ruben Richardson:[id number] DOB: 08/07/1976 DOA: 03/31/2018  PCP: Reginald Richardson, Reginald Z, MD  Admit date: 03/31/2018 Discharge date: 04/02/2018  Admitted From: Home Disposition:  [facility]     Discharge Condition: Stable CODE STATUS: FULL Diet recommendation: 2 gram Na   Brief/Interim Summary: 42 y.o. 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  The patient was initially admitted to the hospital from 01/22/2018 through 01/30/2018 for decompensated cirrhosis.  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  Since then, the patient has been evaluated at the liver [facility].  He has not yet been listed for transplant.  He underwent ERCP on 03/21/2018.  Apparently, the common bile duct could not be cannulated.  Sphincterotomy was performed with significant oozing. Ventral pancreatic duct was inadvertently cannulated, complete pancreas divisum was identified. Plans for repeat ERCP for repeat attempt at cannulation, currently on the schedule for June 25.  MELD 28 on 03/08/18 In the emergency department, the patient underwent paracentesis with removal of 1600 cc of fluid.  The fluid was sent for analysis and revealed WBC 32,731 with 94% neutrophils.  The patient was started on Zosyn for treatment of spontaneous bacterial peritonitis.  GI was consulted and recommended transfer [facility] for further evaluation and care.  The patient's gastroenterologist, Dr. Julieta Richardson, was contacted, and accepted the patient, but there has been no beds available at [facility].  EDP, Dr. Estell Richardson subsequently discussed with GI, Dr. Jena Richardson, who felt the patient could be admitted to the medical floor while waiting for transfer.   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 -Patient is awaiting [facility] -pt will need repeat paracentesis--unfortunately IR not available today to [facility] -hopeful to be performed once [facility] -Continue furosemide and spironolactone -appreciate GI follow up -repeat ERCP on 04/04/18 @ [facility]  AKI -due to volume depletion -repeat BMP after albumin and IVF-->improving -baseline creatinine 0.5-0.7 -presented with serum creatinine 1.30 -CMP pending this am  Hyponatremia -Secondary to liver cirrhosis and volume depletion and AKI with impaired renal Na excretion -baseline Na 130-134 -Repeat BMP, monitor clinically  Severe protein calorie malnutrition -Start supplementation if patient able to tolerate  Coagulopathy -Secondary to liver cirrhosis -INR 4.23 -given vitamin K 10 mg po x 2  Elevated Lipase -unclear clinical significance -not consistent with pancreatitis   Discharge Instructions   Allergies as of 04/02/2018   No Known Allergies     Medication List    TAKE these medications   furosemide 40 MG tablet Commonly known as:  LASIX Take 1 tablet (40 mg total) by mouth daily with breakfast.   piperacillin-tazobactam 3.375 GM/50ML IVPB Commonly known as:  ZOSYN Inject 50 mLs (3.375 g total) into the vein every 8 (eight) hours.   spironolactone 100 MG tablet Commonly known as:  ALDACTONE Take 1 tablet (100 mg total) by mouth daily with breakfast.       No Known Allergies  Consultations:  [facility]   Procedures/Studies: Dg Chest 2 View  Result Date: 03/31/2018 CLINICAL DATA:  Shortness of breath. Worsening pain today  and tonight. Awaiting for  liver transplant. EXAM: CHEST - 2 VIEW COMPARISON:  01/07/2018 FINDINGS: Shallow inspiration with linear atelectasis in the lung bases, increasing since previous study. No focal consolidation. No blunting of costophrenic angles. No pneumothorax. Heart size and pulmonary vascularity are normal. IMPRESSION: Shallow inspiration with linear atelectasis in the lung bases, increasing since previous study. Electronically Signed   By: Burman Nieves M.D.   On: 03/31/2018 03:43         Discharge Exam: Vitals:   04/01/18 2127 04/02/18 0556  BP: 121/66 126/66  Pulse: 92 85  Resp: 19 17  Temp: 97.6 F (36.4 C) (!) 97.5 F (36.4 C)  SpO2: 100% 100%   Vitals:   04/01/18 1140 04/01/18 1311 04/01/18 2127 04/02/18 0556  BP: 115/69 123/66 121/66 126/66  Pulse: 88 83 92 85  Resp: 18 20 19 17   Temp: 97.6 F (36.4 C) 97.8 F (36.6 C) 97.6 F (36.4 C) (!) 97.5 F (36.4 C)  TempSrc: Oral  Oral Oral  SpO2: 100% 100% 100% 100%  Weight:      Height:        General: Pt is alert, awake, not in acute distress Cardiovascular: RRR, S1/S2 +, no rubs, no gallops Respiratory: bibasilar crackles Abdominal: distended, tense ascites bowel sounds + Extremities: no edema, no cyanosis   The results of significant diagnostics from this hospitalization (including imaging, microbiology, ancillary and laboratory) are listed below for reference.    Significant Diagnostic Studies: Dg Chest 2 View  Result Date: 03/31/2018 CLINICAL DATA:  Shortness of breath. Worsening pain today and tonight. Awaiting for liver transplant. EXAM: CHEST - 2 VIEW COMPARISON:  01/07/2018 FINDINGS: Shallow inspiration with linear atelectasis in the lung bases, increasing since previous study. No focal consolidation. No blunting of costophrenic angles. No pneumothorax. Heart size and pulmonary vascularity are normal. IMPRESSION: Shallow inspiration with linear atelectasis in the lung bases, increasing since previous study. Electronically  Signed   By: Burman Nieves M.D.   On: 03/31/2018 03:43     Microbiology: Recent Results (from the past 240 hour(s))  Blood culture (routine x 2)     Status: None (Preliminary result)   Collection Time: 03/31/18  4:00 AM  Result Value Ref Range Status   Specimen Description BLOOD RIGHT HAND  Final   Special Requests   Final    BOTTLES DRAWN AEROBIC AND ANAEROBIC Blood Culture adequate volume   Culture   Final    NO GROWTH 2 DAYS Performed at [facility], [address], [place], [place] [address]    Report Status PENDING  Incomplete  Blood culture (routine x 2)     Status: None (Preliminary result)   Collection Time: 03/31/18  4:10 AM  Result Value Ref Range Status   Specimen Description BLOOD LEFT HAND  Final   Special Requests   Final    BOTTLES DRAWN AEROBIC AND ANAEROBIC Blood Culture results may not be optimal due to an inadequate volume of blood received in culture bottles   Culture   Final    NO GROWTH 2 DAYS Performed at [facility], [address] [place], [place] [address]    Report Status PENDING  Incomplete  Culture, body fluid-bottle     Status: None (Preliminary result)   Collection Time: 03/31/18 12:57 PM  Result Value Ref Range Status   Specimen Description   Final    ASCITIC Performed at [facility], [address], [place], [place] [address]    Special Requests  Final    BOTTLES DRAWN AEROBIC AND ANAEROBIC [facility] EACH Performed at [facility], [address] [place], [place] [address]    Gram Stain   Final    GRAM NEGATIVE RODS Gram Stain Report Called to,Read Back By and Verified With: MARTIN,D. AT 1350 ON 04/01/2018 BY EVA ANAEROBIC BOTTLE ONLY Performed at [facility] Organism ID to follow Performed at [facility], [address] [place], [place] [address]    Culture GRAM NEGATIVE RODS  Final   Report Status PENDING  Incomplete  Gram stain     Status: None   Collection Time: 03/31/18 12:57 PM  Result Value Ref Range Status    Specimen Description ASCITIC  Final   Special Requests NONE  Final   Gram Stain   Final    NO ORGANISMS SEEN CYTOSPIN SMEAR WBC PRESENT,BOTH PMN AND MONONUCLEAR Performed at [facility], [address] [place], [place] [address]    Report Status 03/31/2018 FINAL  Final     Labs: Basic Metabolic Panel: Recent Labs  Lab 03/31/18 0207 04/01/18 1216  NA 124* 126*  K 4.1 4.5  CL 95* 95*  CO2 22 20*  GLUCOSE 71 106*  BUN 25* 30*  CREATININE 1.30* 1.00  CALCIUM 7.6* 7.7*   Liver Function Tests: Recent Labs  Lab 03/31/18 0207 04/01/18 1216  AST 225* 238*  ALT 92* 93*  ALKPHOS 422* 433*  BILITOT 14.4* 16.8*  PROT 6.5 6.8  ALBUMIN 1.2* 1.7*   Recent Labs  Lab 03/31/18 0207  LIPASE 151*   No results for input(s): AMMONIA in the last 168 hours. CBC: Recent Labs  Lab 03/31/18 0207 04/01/18 1216  WBC 19.2* 26.7*  NEUTROABS 16.8* 24.0*  HGB 8.9* 9.5*  HCT 25.1* 26.8*  MCV 83.9 85.6  PLT 321 400   Cardiac Enzymes: No results for input(s): CKTOTAL, CKMB, CKMBINDEX, TROPONINI in the last 168 hours. BNP: Invalid input(s): POCBNP CBG: No results for input(s): GLUCAP in the last 168 hours.  Time coordinating discharge:  36 minutes  Signed:  Catarina Hartshorn, DO [facility] Pager: [phone] 04/02/2018, 8:58 AM

## 2018-04-02 NOTE — Progress Notes (Signed)
Report called to Jhonnie GarnerWen, Charity fundraiserN at [facility], patient to be transferred to [address] [facility].  Patient awaiting transport arrival.

## 2018-04-03 LAB — CULTURE, BODY FLUID-BOTTLE

## 2018-04-03 LAB — CULTURE, BODY FLUID W GRAM STAIN -BOTTLE

## 2018-04-05 LAB — CULTURE, BLOOD (ROUTINE X 2)
Culture: NO GROWTH
Culture: NO GROWTH
Special Requests: ADEQUATE

## 2018-04-10 DEATH — deceased

## 2018-04-12 ENCOUNTER — Ambulatory Visit: Payer: PRIVATE HEALTH INSURANCE | Admitting: Gastroenterology

## 2018-04-24 NOTE — Telephone Encounter (Signed)
Patient is deceased.

## 2020-04-01 IMAGING — MR MR MRCP
6 of 9 series · 29 of 48 positions shown · non-contrast
Comparison: MRI abdomen 01/23/2018, CT 01/22/2018

CLINICAL DATA: Decompensated cirrhosis. Abnormal liver function
tests. Bilirubin equal 14

EXAM:
MRI ABDOMEN WITHOUT CONTRAST  (INCLUDING MRCP)
TECHNIQUE: Multiplanar multisequence MR imaging of the abdomen was performed.
Heavily T2-weighted images of the biliary and pancreatic ducts were
obtained, and three-dimensional MRCP images were rendered by post
processing.

[Series 4: T2 · coronal · 5.0mm · 1.12mm/px · 3 of 36 slices shown]
[im 1/36]
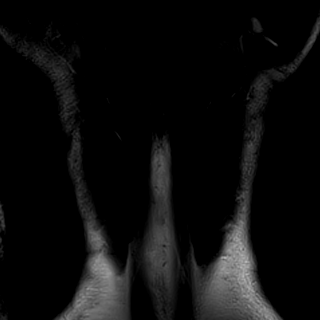
[im 18/36]
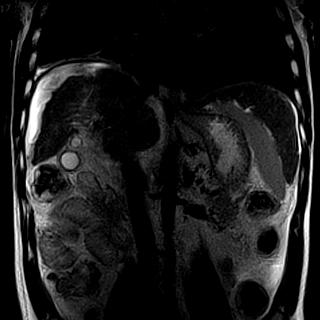
[im 36/36]
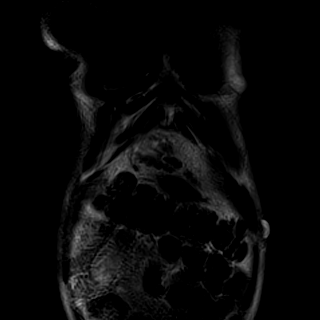

[Series 5: t2fs axial blade · axial · 4.0mm · 1.16mm/px · z∈[-80,+145]mm · 5 of 48 slices shown]
[im 1/48]
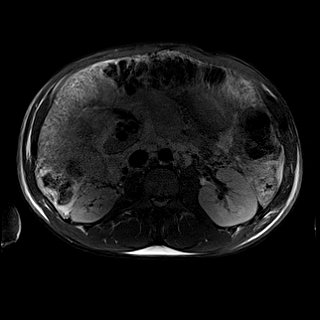
[im 12/48]
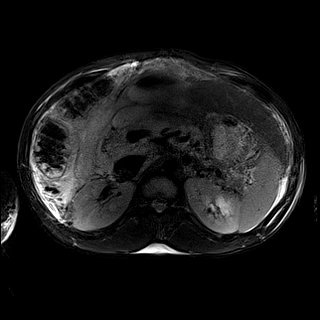
[im 24/48]
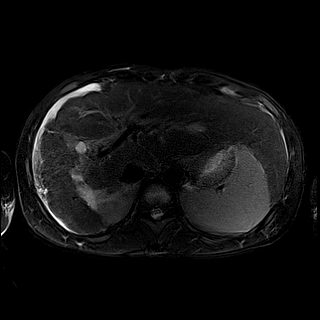
[im 36/48]
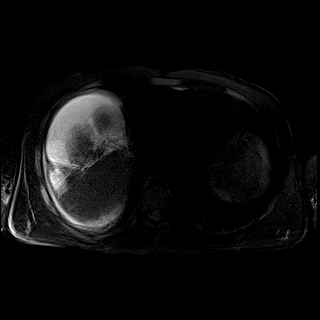
[im 48/48]
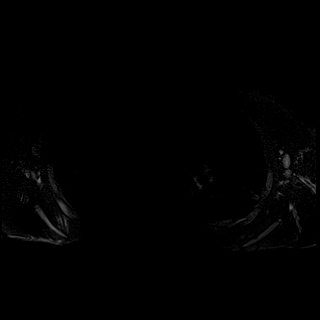

[Series 6: MRCP · coronal · 4.0mm · 0.78mm/px · 2 of 15 slices shown]
[im 1/15]
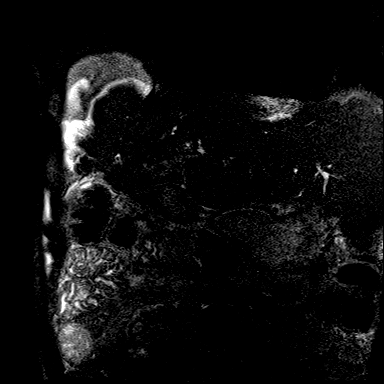
[im 15/15]
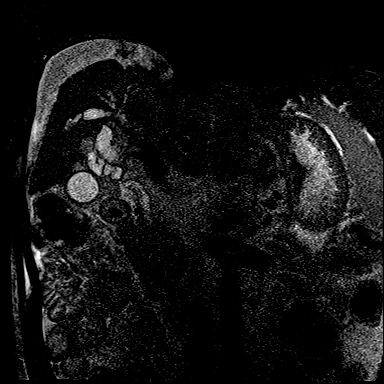

[Series 11: DWI · axial · 5.0mm · 0.95mm/px · z∈[-97,+137]mm · 8 of 80 slices shown (1 of 2)]
[im 1/80]
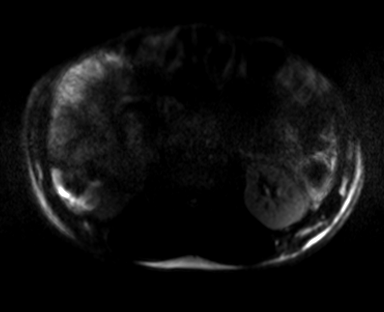
[im 12/80]
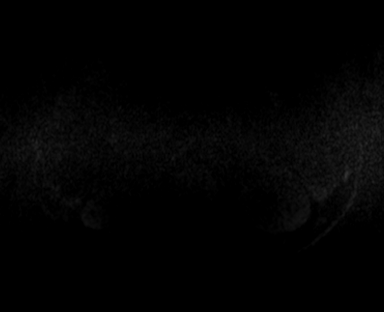
[im 23/80]
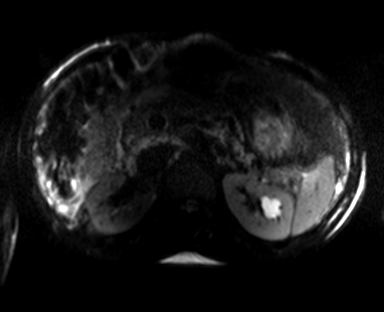
[im 34/80]
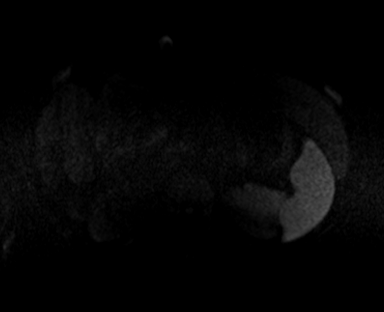
[im 46/80]
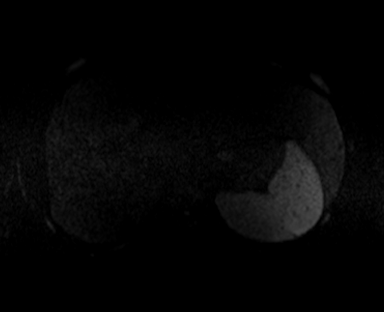
[im 57/80]
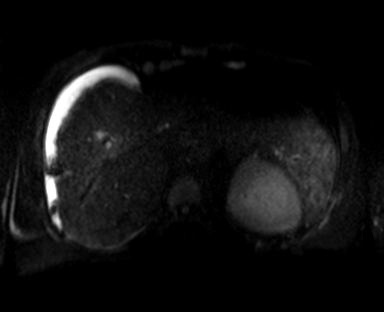
[im 68/80]
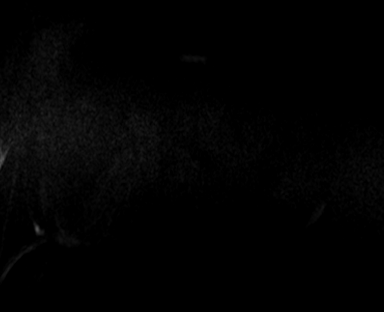
[im 80/80]
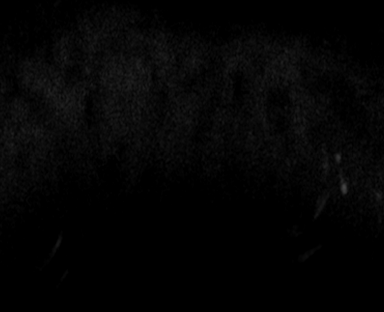

[Series 12: DWI · axial · 5.0mm · 0.95mm/px · z∈[-97,+137]mm · 4 of 40 slices shown (2 of 2)]
[im 1/40]
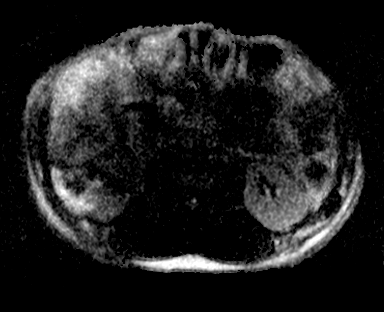
[im 14/40]
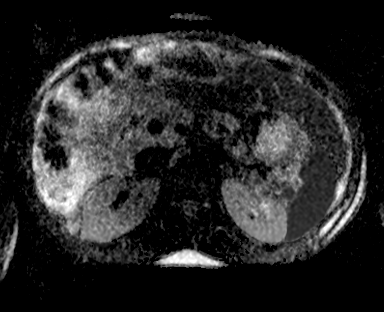
[im 27/40]
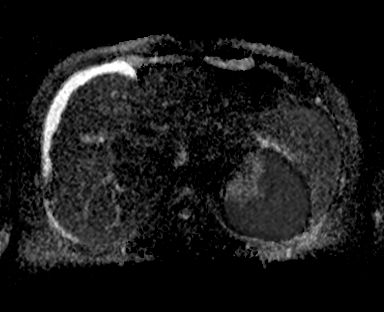
[im 40/40]
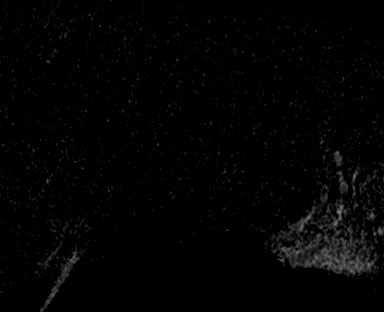

[Series 14: T1 · axial · 4.0mm · 0.56mm/px · z∈[-128,+124]mm · 7 of 64 slices shown]
[im 1/64]
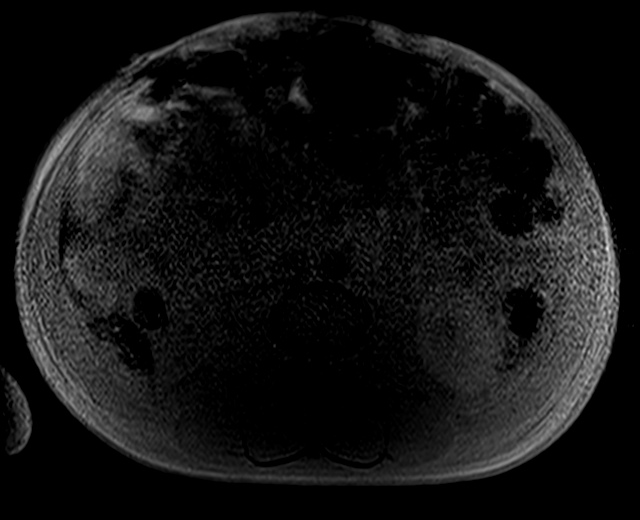
[im 11/64]
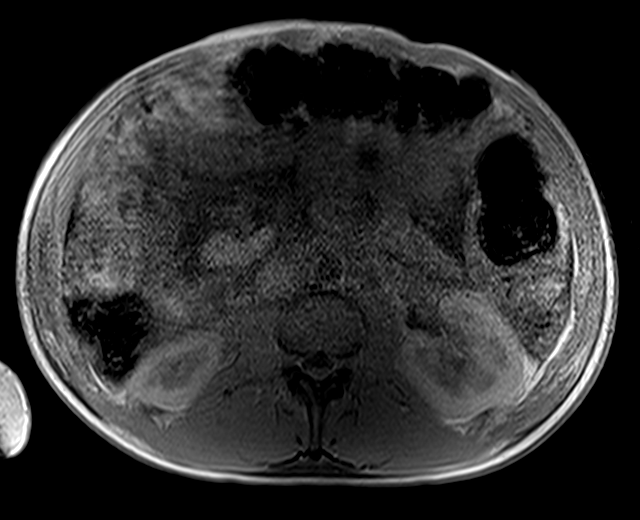
[im 22/64]
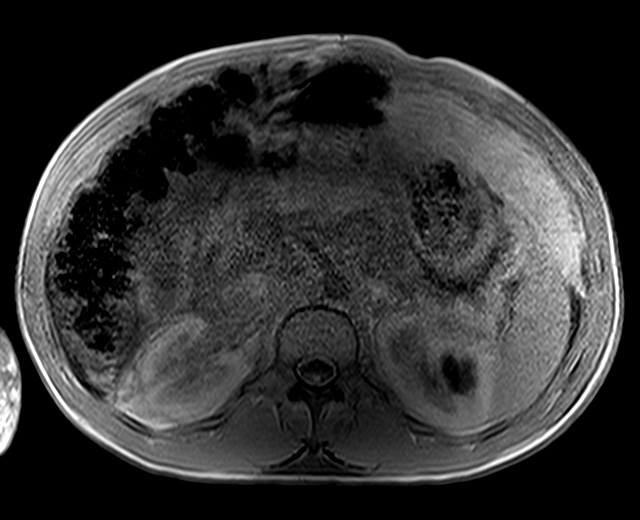
[im 32/64]
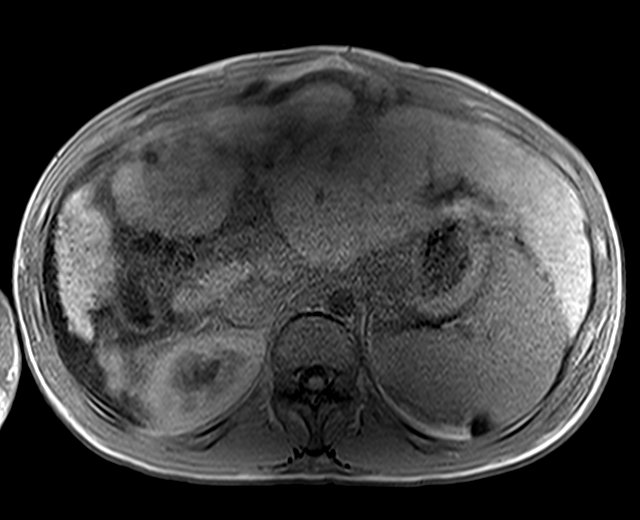
[im 43/64]
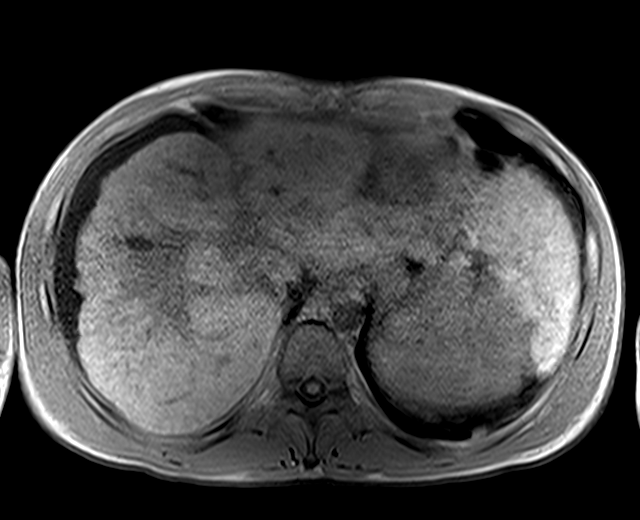
[im 53/64]
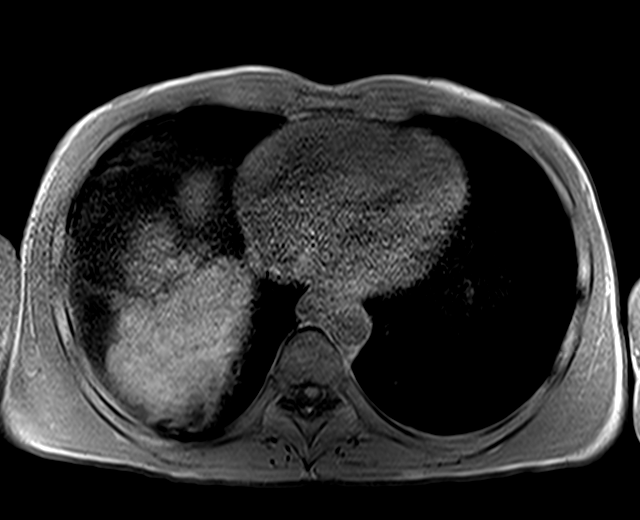
[im 64/64]
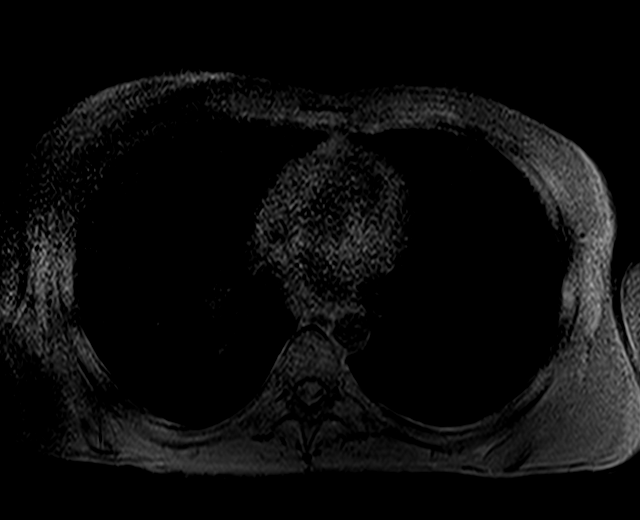

[29 of 48 positions shown; findings below may reference images not displayed]

FINDINGS: Exam is severely degraded by patient motion.

Lower chest:  Lung bases are clear.

Hepatobiliary: Morphologic changes in liver consistent cirrhosis
again noted. Common hepatic duct and common bile duct are distended
to a similar degree to comparison MRI. Difficult to measure but the
common hepatic duct measures 12 mm (image [DATE]) and the common bile
duct measures approximately 8 mm. No filling defects within ducts
noted.

There is duct dilatation segmentally within the RIGHT hepatic lobe
(segment 8). No additional intrahepatic duct dilatation. These
findings are not changed.

The gallbladder is elongated with mild gallbladder wall thickening
related to ascites.

Pancreas: Normal pancreatic parenchymal intensity. No ductal
dilatation or inflammation.

Spleen: Splenomegaly

Adrenals/urinary tract: Adrenal glands and kidneys are normal.

Stomach/Bowel: Stomach and limited of the small bowel is
unremarkable

Vascular/Lymphatic: Abdominal aortic normal caliber. No
retroperitoneal periportal lymphadenopathy.

Musculoskeletal: No aggressive osseous lesion
IMPRESSION: 1. Exam is severely degraded by patient motion.
2. No significant change from MRI 2 days prior.
3. Mild to moderate extrahepatic duct dilatation. No significant
intrahepatic duct dilatation. No obstructing lesion identified.
4. Moderate volume ascites.
